# Patient Record
Sex: Female | Born: 1985
Health system: Southern US, Community
[De-identification: ages and names within clinical notes are randomized; demographics above are authoritative.]

## PROBLEM LIST (undated history)

## (undated) DIAGNOSIS — N390 Urinary tract infection, site not specified: Secondary | ICD-10-CM

## (undated) DIAGNOSIS — B019 Varicella without complication: Secondary | ICD-10-CM

## (undated) DIAGNOSIS — F419 Anxiety disorder, unspecified: Secondary | ICD-10-CM

## (undated) HISTORY — DX: Varicella without complication: B01.9

## (undated) HISTORY — DX: Anxiety disorder, unspecified: F41.9

## (undated) HISTORY — DX: Urinary tract infection, site not specified: N39.0

---
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
## 2014-04-24 ENCOUNTER — Encounter: Payer: Self-pay | Admitting: Family

## 2014-04-24 ENCOUNTER — Ambulatory Visit (INDEPENDENT_AMBULATORY_CARE_PROVIDER_SITE_OTHER): Payer: 59 | Admitting: Family

## 2014-04-24 VITALS — BP 108/72 | HR 63 | Temp 98.2°F | Ht 63.0 in | Wt 126.5 lb

## 2014-04-24 DIAGNOSIS — J309 Allergic rhinitis, unspecified: Secondary | ICD-10-CM

## 2014-04-24 NOTE — Progress Notes (Signed)
Pre visit review using our clinic review tool, if applicable. No additional management support is needed unless otherwise documented below in the visit note. 

## 2014-04-24 NOTE — Assessment & Plan Note (Signed)
Symptoms and exam consistent with allergic rhinitis. Recommend starting over-the-counter second-generation antihistamine. May also include over-the-counter steroid. Continue other over-the-counter medications as needed for symptom relief. Follow-up if symptoms worsen or fail to improve. May require referral to allergy testing.

## 2014-04-24 NOTE — Patient Instructions (Signed)
Thank you for choosing [facility].  Summary/Instructions:  Please schedule a time for your physical.   If your symptoms worsen or fail to improve, please contact our office for further instruction, or in case of emergency go directly to the emergency room at the closest medical facility.    General Recommendations:    Please drink plenty of fluids.  Get plenty of rest   Sleep in humidified air  Use saline nasal sprays  Netti pot   OTC Medications:  Decongestants - helps relieve congestion   Flonase (generic fluticasone) or Nasacort (generic triamcinolone) - please make sure to use the "cross-over" technique at a 45 degree angle towards the opposite eye as opposed to straight up the nasal passageway.   Sudafed (generic pseudoephedrine - Note this is the one that is available behind the pharmacy counter); Products with phenylephrine (-PE) may also be used but is often not as effective as pseudoephedrine.   If you have HIGH BLOOD PRESSURE - Coricidin HBP; AVOID any product that is -D as this contains pseudoephedrine which may increase your blood pressure.  Afrin (oxymetazoline) every 6-8 hours for up to 3 days.   Allergies - helps relieve runny nose, itchy eyes and sneezing   Claritin (generic loratidine), Allegra (fexofenidine), or Zyrtec (generic cyrterizine) for runny nose. These medications should not cause drowsiness.  Note - Benadryl (generic diphenhydramine) may be used however may cause drowsiness  Cough -   Delsym or Robitussin (generic dextromethorphan)  Expectorants - helps loosen mucus to ease removal   Mucinex (generic guaifenesin) as directed on the package.  Headaches / General Aches   Tylenol (generic acetaminophen) - DO NOT EXCEED 3 grams (3,000 mg) in a 24 hour time period  Advil/Motrin (generic ibuprofen)   Sore Throat -   Salt water gargle   Chloraseptic (generic benzocaine) spray or lozenges / Sucrets (generic dyclonine)       Allergies Allergies may happen from anything your body is sensitive to. This may be food, medicines, pollens, chemicals, and nearly anything around you in everyday life that produces allergens. An allergen is anything that causes an allergy producing substance. Heredity is often a factor in causing these problems. This means you may have some of the same allergies as your parents. Food allergies happen in all age groups. Food allergies are some of the most severe and life threatening. Some common food allergies are cow's milk, seafood, eggs, nuts, wheat, and soybeans. SYMPTOMS  4. Swelling around the mouth. 5. An itchy red rash or hives. 6. Vomiting or diarrhea. 7. Difficulty breathing. SEVERE ALLERGIC REACTIONS ARE LIFE-THREATENING. This reaction is called anaphylaxis. It can cause the mouth and throat to swell and cause difficulty with breathing and swallowing. In severe reactions only a trace amount of food (for example, peanut oil in a salad) may cause death within seconds. Seasonal allergies occur in all age groups. These are seasonal because they usually occur during the same season every year. They may be a reaction to molds, grass pollens, or tree pollens. Other causes of problems are house dust mite allergens, pet dander, and mold spores. The symptoms often consist of nasal congestion, a runny itchy nose associated with sneezing, and tearing itchy eyes. There is often an associated itching of the mouth and ears. The problems happen when you come in contact with pollens and other allergens. Allergens are the particles in the air that the body reacts to with an allergic reaction. This causes you to release allergic antibodies. Through  a chain of events, these eventually cause you to release histamine into the blood stream. Although it is meant to be protective to the body, it is this release that causes your discomfort. This is why you were given anti-histamines to feel better. If you are  unable to pinpoint the offending allergen, it may be determined by skin or blood testing. Allergies cannot be cured but can be controlled with medicine. Hay fever is a collection of all or some of the seasonal allergy problems. It may often be treated with simple over-the-counter medicine such as diphenhydramine. Take medicine as directed. Do not drink alcohol or drive while taking this medicine. Check with your caregiver or package insert for child dosages. If these medicines are not effective, there are many new medicines your caregiver can prescribe. Stronger medicine such as nasal spray, eye drops, and corticosteroids may be used if the first things you try do not work well. Other treatments such as immunotherapy or desensitizing injections can be used if all else fails. Follow up with your caregiver if problems continue. These seasonal allergies are usually not life threatening. They are generally more of a nuisance that can often be handled using medicine. HOME CARE INSTRUCTIONS  2. If unsure what causes a reaction, keep a diary of foods eaten and symptoms that follow. Avoid foods that cause reactions. 3. If hives or rash are present: 1. Take medicine as directed. 2. You may use an over-the-counter antihistamine (diphenhydramine) for hives and itching as needed. 3. Apply cold compresses (cloths) to the skin or take baths in cool water. Avoid hot baths or showers. Heat will make a rash and itching worse. 4. If you are severely allergic: 1. Following a treatment for a severe reaction, hospitalization is often required for closer follow-up. 2. Wear a medic-alert bracelet or necklace stating the allergy. 3. You and your family must learn how to give adrenaline or use an anaphylaxis kit. 4. If you have had a severe reaction, always carry your anaphylaxis kit or EpiPen with you. Use this medicine as directed by your caregiver if a severe reaction is occurring. Failure to do so could have a fatal  outcome. SEEK MEDICAL CARE IF: 2. You suspect a food allergy. Symptoms generally happen within 30 minutes of eating a food. 3. Your symptoms have not gone away within 2 days or are getting worse. 4. You develop new symptoms. 5. You want to retest yourself or your child with a food or drink you think causes an allergic reaction. Never do this if an anaphylactic reaction to that food or drink has happened before. Only do this under the care of a caregiver. SEEK IMMEDIATE MEDICAL CARE IF:  3. You have difficulty breathing, are wheezing, or have a tight feeling in your chest or throat. 4. You have a swollen mouth, or you have hives, swelling, or itching all over your body. 5. You have had a severe reaction that has responded to your anaphylaxis kit or an EpiPen. These reactions may return when the medicine has worn off. These reactions should be considered life threatening. MAKE SURE YOU:  3. Understand these instructions. 4. Will watch your condition. 5. Will get help right away if you are not doing well or get worse. Document Released: 05/20/2002 Document Revised: 06/21/2012 Document Reviewed: 10/25/2007 [facility] Patient Information 2015 [place], [place]. This information is not intended to replace advice given to you by your health care provider. Make sure you discuss any questions you have with your health care  provider.

## 2014-04-24 NOTE — Progress Notes (Signed)
   Subjective:    Patient ID: Cynthia Weaver, female    DOB: 11-Jun-1985, 29 y.o.   MRN: [id number]  Chief Complaint  Patient presents with  . Establish Care    Discuss cough and congestion     HPI:  Cynthia Weaver is a 29 y.o. female who presents today to establish care and discuss constant illness.   Generalized illness/Allergies - This is a new problem. Associated symptoms of congestion and cough which has been going on for about a month and half. Indicates the symptoms wax and wane. Modifying treatments of robutussin and dayquil which help with the symptoms. Timing the of symptoms is worse in the morning.    Allergies  Allergen Reactions  . Penicillins Hives    No current outpatient prescriptions on file prior to visit.   No current facility-administered medications on file prior to visit.    Past Medical History  Diagnosis Date  . Chicken pox   . UTI (lower urinary tract infection)     History reviewed. No pertinent past surgical history.  Family History  Problem Relation Age of Onset  . Healthy Mother   . Healthy Father   . Breast cancer Maternal Aunt   . Prostate cancer Maternal Uncle     History   Social History  . Marital Status: Married    Spouse Name: N/A  . Number of Children: N/A  . Years of Education: N/A   Occupational History  . Not on file.   Social History Main Topics  . Smoking status: Never Smoker   . Smokeless tobacco: Never Used  . Alcohol Use: 0.0 oz/week    0 Standard drinks or equivalent per week     Comment: occasional  . Drug Use: No  . Sexual Activity: Yes    Birth Control/ Protection: Condom   Other Topics Concern  . Not on file   Social History Narrative    Review of Systems  Constitutional: Negative for fever and chills.  HENT: Positive for congestion and sneezing. Negative for sinus pressure and sore throat.   Eyes: Positive for itching.  Respiratory: Positive for cough.   Neurological: Negative for headaches.       Objective:    BP 108/72 mmHg  Pulse 63  Temp(Src) 98.2 F (36.8 C) (Oral)  Wt 126 lb 8 oz (57.38 kg)  SpO2 98% Nursing note and vital signs reviewed.  Physical Exam  Constitutional: She is oriented to person, place, and time. She appears well-developed and well-nourished. No distress.  Cardiovascular: Normal rate, regular rhythm, normal heart sounds and intact distal pulses.   Pulmonary/Chest: Effort normal and breath sounds normal.  Neurological: She is alert and oriented to person, place, and time.  Skin: Skin is warm and dry.  Psychiatric: She has a normal mood and affect. Her behavior is normal. Judgment and thought content normal.       Assessment & Plan:

## 2014-08-19 ENCOUNTER — Telehealth: Payer: Self-pay | Admitting: Family

## 2014-08-19 NOTE — Telephone Encounter (Signed)
Records received from [facility], forwarded to Primary Care, rmf.

## 2014-09-20 LAB — OB RESULTS CONSOLE RPR: RPR: NONREACTIVE

## 2014-09-21 ENCOUNTER — Telehealth: Payer: Self-pay | Admitting: Family

## 2014-09-21 NOTE — Telephone Encounter (Signed)
Received records from [place] forwarded to [facility] 09/21/14 fbg.

## 2014-09-22 LAB — OB RESULTS CONSOLE GC/CHLAMYDIA
CHLAMYDIA, DNA PROBE: NEGATIVE
Gonorrhea: NEGATIVE

## 2014-09-22 LAB — OB RESULTS CONSOLE ABO/RH: RH Type: POSITIVE

## 2014-09-22 LAB — OB RESULTS CONSOLE RUBELLA ANTIBODY, IGM: RUBELLA: IMMUNE

## 2014-09-22 LAB — OB RESULTS CONSOLE ANTIBODY SCREEN: Antibody Screen: NEGATIVE

## 2014-09-22 LAB — OB RESULTS CONSOLE HIV ANTIBODY (ROUTINE TESTING): HIV: NONREACTIVE

## 2014-09-22 LAB — OB RESULTS CONSOLE HEPATITIS B SURFACE ANTIGEN: HEP B S AG: NEGATIVE

## 2014-10-20 ENCOUNTER — Telehealth: Payer: Self-pay | Admitting: Family

## 2014-10-20 NOTE — Telephone Encounter (Signed)
Received records from [facility] forwarded 6 pages to Dr. Mauricio Po 10/20/14 fbg.

## 2014-11-20 ENCOUNTER — Telehealth: Payer: Self-pay | Admitting: Family

## 2014-11-20 NOTE — Telephone Encounter (Signed)
Rec'd from [facility] forward 6 pages to Dr. Elna Breslow

## 2015-03-11 NOTE — L&D Delivery Note (Signed)
Delivery Note Pt with increased pressure, SVE 10/100/+2 - AROM at 10:35 for clear fluid.  At 10:37 PM a viable and healthy female was delivered via Vaginal, Spontaneous Delivery (Presentation: Left Occiput Anterior).  APGAR: 9, 9; weight  P.   Placenta status: Intact, Spontaneous.  Cord: 3 vessels with the following complications: None.    Anesthesia: None  Episiotomy: None Lacerations: 2nd degree;Perineal Suture Repair: 3.0 vicryl rapide Est. Blood Loss (mL): 40  Mom to postpartum.  Baby to Couplet care / Skin to Skin.  Pt and physician discussed prior to  Delivery pt desire for removal of skin tag on Mons, d/w pt r/b/a. Anesthetized with 1% lidocaine, removed sharply.  Skin edges reapproximated with figure of eight suture.  Skin tag/mole sent to pathology  Cynthia Weaver, Cynthia Weaver 04/12/2015, 11:27 PM  Br/B+/RI/Tdap refused in PNC/Contra?

## 2015-04-12 ENCOUNTER — Inpatient Hospital Stay (HOSPITAL_COMMUNITY)
Admission: AD | Admit: 2015-04-12 | Discharge: 2015-04-14 | DRG: 775 | Disposition: A | Discharge status: Home / Self Care | Payer: 59 | Source: Ambulatory Visit | Attending: Obstetrics and Gynecology | Admitting: Obstetrics and Gynecology

## 2015-04-12 ENCOUNTER — Encounter (HOSPITAL_COMMUNITY): Payer: Self-pay | Admitting: Anesthesiology

## 2015-04-12 ENCOUNTER — Encounter (HOSPITAL_COMMUNITY): Payer: Self-pay | Admitting: *Deleted

## 2015-04-12 DIAGNOSIS — D280 Benign neoplasm of vulva: Secondary | ICD-10-CM | POA: Diagnosis not present

## 2015-04-12 DIAGNOSIS — Z3A39 39 weeks gestation of pregnancy: Secondary | ICD-10-CM | POA: Diagnosis not present

## 2015-04-12 DIAGNOSIS — O99824 Streptococcus B carrier state complicating childbirth: Secondary | ICD-10-CM | POA: Diagnosis present

## 2015-04-12 DIAGNOSIS — IMO0001 Reserved for inherently not codable concepts without codable children: Secondary | ICD-10-CM

## 2015-04-12 DIAGNOSIS — L918 Other hypertrophic disorders of the skin: Secondary | ICD-10-CM | POA: Diagnosis present

## 2015-04-12 LAB — CBC
HEMATOCRIT: 37.7 % (ref 36.0–46.0)
Hemoglobin: 13.9 g/dL (ref 12.0–15.0)
MCH: 33.6 pg (ref 26.0–34.0)
MCHC: 36.9 g/dL — AB (ref 30.0–36.0)
MCV: 91.1 fL (ref 78.0–100.0)
Platelets: 250 10*3/uL (ref 150–400)
RBC: 4.14 MIL/uL (ref 3.87–5.11)
RDW: 12.6 % (ref 11.5–15.5)
WBC: 12.9 10*3/uL — ABNORMAL HIGH (ref 4.0–10.5)

## 2015-04-12 LAB — TYPE AND SCREEN
ABO/RH(D): B POS
ANTIBODY SCREEN: NEGATIVE

## 2015-04-12 LAB — OB RESULTS CONSOLE GBS: STREP GROUP B AG: POSITIVE

## 2015-04-12 MED ORDER — OXYCODONE-ACETAMINOPHEN 5-325 MG PO TABS
2.0000 | ORAL_TABLET | ORAL | Status: DC | PRN
  Filled 2015-04-12: qty 2

## 2015-04-12 MED ORDER — ACETAMINOPHEN 325 MG PO TABS: 650.0000 mg | ORAL_TABLET | ORAL | Status: DC | PRN

## 2015-04-12 MED ORDER — ONDANSETRON HCL 4 MG/2ML IJ SOLN: 4.0000 mg | Freq: Four times a day (QID) | INTRAMUSCULAR | Status: DC | PRN

## 2015-04-12 MED ORDER — LIDOCAINE HCL (PF) 1 % IJ SOLN
30.0000 mL | INTRAMUSCULAR | Status: DC | PRN
  Administered 2015-04-12: 30 mL via SUBCUTANEOUS
  Filled 2015-04-12: qty 30

## 2015-04-12 MED ORDER — CEFAZOLIN SODIUM-DEXTROSE 2-3 GM-% IV SOLR
2.0000 g | Freq: Once | INTRAVENOUS | Status: AC
  Administered 2015-04-12: 2 g via INTRAVENOUS
  Filled 2015-04-12: qty 50

## 2015-04-12 MED ORDER — IBUPROFEN 600 MG PO TABS
600.0000 mg | ORAL_TABLET | Freq: Four times a day (QID) | ORAL | Status: DC
  Administered 2015-04-12 – 2015-04-14 (×6): 600 mg via ORAL
  Filled 2015-04-12 (×6): qty 1

## 2015-04-12 MED ORDER — FLEET ENEMA 7-19 GM/118ML RE ENEM: 1.0000 | ENEMA | Freq: Once | RECTAL | Status: DC

## 2015-04-12 MED ORDER — CITRIC ACID-SODIUM CITRATE 334-500 MG/5ML PO SOLN
30.0000 mL | ORAL | Status: DC | PRN
Start: 2015-04-12 — End: 2015-04-13

## 2015-04-12 MED ORDER — FENTANYL 2.5 MCG/ML BUPIVACAINE 1/10 % EPIDURAL INFUSION (WH - ANES)
14.0000 mL/h | INTRAMUSCULAR | Status: DC | PRN
  Filled 2015-04-12: qty 125

## 2015-04-12 MED ORDER — PHENYLEPHRINE 40 MCG/ML (10ML) SYRINGE FOR IV PUSH (FOR BLOOD PRESSURE SUPPORT)
80.0000 ug | PREFILLED_SYRINGE | INTRAVENOUS | Status: DC | PRN
  Filled 2015-04-12: qty 2
  Filled 2015-04-12: qty 20

## 2015-04-12 MED ORDER — OXYCODONE-ACETAMINOPHEN 5-325 MG PO TABS
1.0000 | ORAL_TABLET | ORAL | Status: DC | PRN
  Administered 2015-04-12: 1 via ORAL

## 2015-04-12 MED ORDER — EPHEDRINE 5 MG/ML INJ
10.0000 mg | INTRAVENOUS | Status: DC | PRN
  Filled 2015-04-12: qty 2

## 2015-04-12 MED ORDER — DIPHENHYDRAMINE HCL 50 MG/ML IJ SOLN: 12.5000 mg | INTRAMUSCULAR | Status: DC | PRN

## 2015-04-12 MED ORDER — CLINDAMYCIN PHOSPHATE 900 MG/50ML IV SOLN: 900.0000 mg | Freq: Once | INTRAVENOUS | Status: DC

## 2015-04-12 MED ORDER — LACTATED RINGERS IV SOLN: 500.0000 mL | INTRAVENOUS | Status: DC | PRN

## 2015-04-12 MED ORDER — OXYTOCIN BOLUS FROM INFUSION
500.0000 mL | INTRAVENOUS | Status: DC
  Administered 2015-04-12: 500 mL via INTRAVENOUS

## 2015-04-12 MED ORDER — OXYTOCIN 10 UNIT/ML IJ SOLN
2.5000 [IU]/h | INTRAVENOUS | Status: DC
  Filled 2015-04-12: qty 4

## 2015-04-12 MED ORDER — LACTATED RINGERS IV SOLN
INTRAVENOUS | Status: DC
  Administered 2015-04-12: 22:00:00 via INTRAVENOUS

## 2015-04-12 MED ORDER — BUTORPHANOL TARTRATE 1 MG/ML IJ SOLN: 1.0000 mg | INTRAMUSCULAR | Status: DC | PRN

## 2015-04-12 NOTE — MAU Note (Signed)
PT SAYS HURT BAD  SINCE 7PM.      VE IN OFFICE  TODAY   3-4  CM.     DENIES  HSV AND MRSA.   GBS-   POSITIVE.

## 2015-04-12 NOTE — H&P (Signed)
Cynthia Weaver is a 30 y.o. female G2P1001 at 39+ presents with advanced cervical dilitation.  +FM, no LOF, no VB, ctx - increasing in intensity and frequency.  Relatively uncomplicated prenatal care.  Dated by first trimester [place].  Has mole on her mons wishes to have removed at delivery, d/w pt risk of scarring.    Maternal Medical History:  Reason for admission: Contractions.   Contractions: Onset was 3-5 hours ago.   Frequency: regular.   Perceived severity is moderate.    Fetal activity: Perceived fetal activity is normal.    Prenatal Complications - Diabetes: none.    OB History    Gravida Para Term Preterm AB TAB SAB Ectopic Multiple Living   2 1 0 0 0 0 0 0 0 1     G1 41wk SVD female "Cynthia Weaver" G2 present  H/o abn pap, cryo, last WNL No STDs  Past Medical History  Diagnosis Date  . Chicken pox   . UTI (lower urinary tract infection)    Past Surgical History  Procedure Laterality Date  . Wisdom tooth extraction    cryotherapy cervix  Family History: family history includes Breast cancer in her maternal aunt; Healthy in her father and mother; Prostate cancer in her maternal uncle. Social History:  reports that she has never smoked. She has never used smokeless tobacco. She reports that she drinks alcohol. She reports that she does not use illicit drugs.married  Meds PNV All PCN - hives   Prenatal Transfer Tool  Maternal Diabetes: No Genetic Screening: Normal Maternal Ultrasounds/Referrals: Normal Fetal Ultrasounds or other Referrals:  None Maternal Substance Abuse:  No Significant Maternal Medications:  None Significant Maternal Lab Results:  Lab values include: Group B Strep positive Other Comments:  CF neg  Review of Systems  Constitutional: Negative.   HENT: Negative.   Eyes: Negative.   Respiratory: Negative.   Cardiovascular: Negative.   Gastrointestinal: Negative.   Genitourinary: Negative.   Musculoskeletal: Negative.   Skin: Negative.    Neurological: Negative.   Psychiatric/Behavioral: Negative.     Dilation: 8 Effacement (%): 90 Station: -2 Exam by:: L. Paschal,RN  Blood pressure 113/76, pulse 93, temperature 97.8 F (36.6 C), temperature source Oral, resp. rate 20, height 5\' 3"  (1.6 m), weight 72.292 kg (159 lb 6 oz). Maternal Exam:  Uterine Assessment: Contraction strength is moderate.  Contraction frequency is regular.   Abdomen: Patient reports no abdominal tenderness. Fundal height is appropriate for gestation.   Estimated fetal weight is 7.5-8.5#.   Fetal presentation: vertex  Introitus: Normal vulva. Normal vagina.  Pelvis: adequate for delivery.   Cervix: Cervix evaluated by digital exam.     Physical Exam  Constitutional: She is oriented to person, place, and time. She appears well-developed and well-nourished.  HENT:  Head: Normocephalic and atraumatic.  Cardiovascular: Normal rate and regular rhythm.   Respiratory: Effort normal and breath sounds normal. No respiratory distress. She has no wheezes.  GI: Soft. Bowel sounds are normal. She exhibits no distension. There is no tenderness.  Musculoskeletal: Normal range of motion.  Neurological: She is alert and oriented to person, place, and time.  Skin: Skin is warm and dry.  Psychiatric: She has a normal mood and affect. Her behavior is normal.    Prenatal labs: ABO, Rh: B/Positive/-- (07/15 0000) Antibody: Negative (07/15 0000) Rubella: Immune (07/15 0000) RPR: Nonreactive (07/13 0000)  HBsAg: Negative (07/15 0000)  HIV: Non-reactive (07/15 0000)  GBS: Positive (02/02 0000)   Hgb 13.5/Plt 290/Ur Cx +  GBBS/CF neg/GC neg/Chl neg/First Trimester Scr WNL/glucola 82  Dated by First Trimester [place] Nl anat, post plac, female Tdap declined Assessment/Plan: 30yo G2P1001 at 39+ in active labor Kefzol for gbbs prophylaxis Epidural prn Expect SVD   Bovard-Stuckert, Mayari Matus 04/12/2015, 10:28 PM

## 2015-04-12 NOTE — Anesthesia Preprocedure Evaluation (Deleted)
Anesthesia Evaluation  Patient identified by MRN, date of birth, ID band Patient awake    Reviewed: Allergy & Precautions, H&P , Patient's Chart, lab work & pertinent test results  Airway Mallampati: II  TM Distance: >3 FB Neck ROM: full    Dental  (+) Teeth Intact   Pulmonary    breath sounds clear to auscultation       Cardiovascular  Rhythm:regular Rate:Normal     Neuro/Psych    GI/Hepatic   Endo/Other    Renal/GU      Musculoskeletal   Abdominal   Peds  Hematology   Anesthesia Other Findings  O/w healthy; now 8cm. Discussed with Dr Rodman Comp ; she is healthy and no perinatal complications  NO LABS NEEDED     Reproductive/Obstetrics (+) Pregnancy                             Anesthesia Physical Anesthesia Plan  ASA: II  Anesthesia Plan: Epidural   Post-op Pain Management:    Induction:   Airway Management Planned:   Additional Equipment:   Intra-op Plan:   Post-operative Plan:   Informed Consent: I have reviewed the patients History and Physical, chart, labs and discussed the procedure including the risks, benefits and alternatives for the proposed anesthesia with the patient or authorized representative who has indicated his/her understanding and acceptance.   Dental Advisory Given  Plan Discussed with:   Anesthesia Plan Comments: (Labs checked- platelets confirmed with RN in room. Fetal heart tracing, per RN, reported to be stable enough for sitting procedure. Discussed epidural, and patient consents to the procedure:  included risk of possible headache,backache, failed block, allergic reaction, and nerve injury. This patient was asked if she had any questions or concerns before the procedure started.)        Anesthesia Quick Evaluation

## 2015-04-13 ENCOUNTER — Encounter (HOSPITAL_COMMUNITY): Payer: Self-pay

## 2015-04-13 LAB — CBC
HEMATOCRIT: 36 % (ref 36.0–46.0)
Hemoglobin: 12.9 g/dL (ref 12.0–15.0)
MCH: 33.1 pg (ref 26.0–34.0)
MCHC: 35.8 g/dL (ref 30.0–36.0)
MCV: 92.3 fL (ref 78.0–100.0)
Platelets: 216 10*3/uL (ref 150–400)
RBC: 3.9 MIL/uL (ref 3.87–5.11)
RDW: 12.6 % (ref 11.5–15.5)
WBC: 15.8 10*3/uL — ABNORMAL HIGH (ref 4.0–10.5)

## 2015-04-13 LAB — RPR: RPR Ser Ql: NONREACTIVE

## 2015-04-13 LAB — ABO/RH: ABO/RH(D): B POS

## 2015-04-13 MED ORDER — ONDANSETRON HCL 4 MG PO TABS: 4.0000 mg | ORAL_TABLET | ORAL | Status: DC | PRN

## 2015-04-13 MED ORDER — ACETAMINOPHEN 325 MG PO TABS: 650.0000 mg | ORAL_TABLET | ORAL | Status: DC | PRN

## 2015-04-13 MED ORDER — BENZOCAINE-MENTHOL 20-0.5 % EX AERO: 1.0000 "application " | INHALATION_SPRAY | CUTANEOUS | Status: DC | PRN

## 2015-04-13 MED ORDER — SIMETHICONE 80 MG PO CHEW: 80.0000 mg | CHEWABLE_TABLET | ORAL | Status: DC | PRN

## 2015-04-13 MED ORDER — DIPHENHYDRAMINE HCL 25 MG PO CAPS: 25.0000 mg | ORAL_CAPSULE | Freq: Four times a day (QID) | ORAL | Status: DC | PRN

## 2015-04-13 MED ORDER — PRENATAL MULTIVITAMIN CH
1.0000 | ORAL_TABLET | Freq: Every day | ORAL | Status: DC
  Administered 2015-04-13: 1 via ORAL
  Filled 2015-04-13: qty 1

## 2015-04-13 MED ORDER — ONDANSETRON HCL 4 MG/2ML IJ SOLN: 4.0000 mg | INTRAMUSCULAR | Status: DC | PRN

## 2015-04-13 MED ORDER — LANOLIN HYDROUS EX OINT: TOPICAL_OINTMENT | CUTANEOUS | Status: DC | PRN

## 2015-04-13 MED ORDER — ZOLPIDEM TARTRATE 5 MG PO TABS: 5.0000 mg | ORAL_TABLET | Freq: Every evening | ORAL | Status: DC | PRN

## 2015-04-13 MED ORDER — WITCH HAZEL-GLYCERIN EX PADS: 1.0000 "application " | MEDICATED_PAD | CUTANEOUS | Status: DC | PRN

## 2015-04-13 MED ORDER — DIBUCAINE 1 % RE OINT: 1.0000 "application " | TOPICAL_OINTMENT | RECTAL | Status: DC | PRN

## 2015-04-13 MED ORDER — LACTATED RINGERS IV SOLN: INTRAVENOUS | Status: DC

## 2015-04-13 MED ORDER — TETANUS-DIPHTH-ACELL PERTUSSIS 5-2.5-18.5 LF-MCG/0.5 IM SUSP
0.5000 mL | Freq: Once | INTRAMUSCULAR | Status: DC
Start: 2015-04-13 — End: 2015-04-14
  Filled 2015-04-13: qty 0.5

## 2015-04-13 MED ORDER — SENNOSIDES-DOCUSATE SODIUM 8.6-50 MG PO TABS
2.0000 | ORAL_TABLET | ORAL | Status: DC
  Administered 2015-04-13: 2 via ORAL
  Filled 2015-04-13: qty 2

## 2015-04-13 NOTE — Lactation Note (Signed)
This note was copied from the chart of Cynthia Chereen Barreca. Lactation Consultation Note  Initial visit made.  Baby is 29 hours old and has been latching easily to breast.  Mom has some concerns about periods of no interest then cluster feeds.  Reassured her this is normal newborn feeding behavior.  Instructed to feed with any cue and to call for assist prn.  Patient Name: Cynthia Weaver [id number] Date: 04/13/2015 Reason for consult: Initial assessment   Maternal Data Formula Feeding for Exclusion: No Does the patient have breastfeeding experience prior to this delivery?: Yes  Feeding    LATCH Score/Interventions                      Lactation Tools Discussed/Used     Consult Status Consult Status: Follow-up Date: 04/14/15 Follow-up type: In-patient    Ave Filter 04/13/2015, 3:05 PM

## 2015-04-13 NOTE — Progress Notes (Signed)
Post Partum Day 1 Subjective: no complaints, up ad lib, tolerating PO and ml lochia, pain controlled  Objective: Blood pressure 97/58, pulse 88, temperature 98.6 F (37 C), temperature source Oral, resp. rate 18, height 5\' 3"  (1.6 m), weight 72.292 kg (159 lb 6 oz), SpO2 98 %, unknown if currently breastfeeding.  Physical Exam:  General: alert and no distress Lochia: appropriate Uterine Fundus: firm  Recent Labs  04/12/15 2220 04/13/15 0611  HGB 13.9 12.9  HCT 37.7 36.0    Assessment/Plan: Plan for discharge tomorrow, Breastfeeding and Lactation consult.  Routine care.     LOS: 1 day   Bovard-Stuckert, Markie Heffernan 04/13/2015, 8:11 AM

## 2015-04-14 NOTE — Discharge Instructions (Signed)
As per discharge pamphlet °

## 2015-04-14 NOTE — Progress Notes (Signed)
PPD #2 No problems Afeb, VSS Fundus firm D/c home

## 2015-04-14 NOTE — Discharge Summary (Signed)
    OB Discharge Summary     Patient Name: Cynthia Weaver DOB: [birth year] MRN: [id number]  Date of admission: 04/12/2015 Delivering MD: Janyth Contes   Date of discharge: 04/14/2015  Admitting diagnosis: 39w ctx Intrauterine pregnancy: [redacted]w[redacted]d     Secondary diagnosis:  Principal Problem:   SVD (spontaneous vaginal delivery) Active Problems:   Active labor at term      Discharge diagnosis: Term Pregnancy [facility] course:  Onset of Labor With Vaginal Delivery     30 y.o. yo G2P1001 at [redacted]w[redacted]d was admitted in Active Labor on 04/12/2015. Patient had an uncomplicated labor course as follows:  Membrane Rupture Time/Date: 10:36 PM ,04/12/2015   Intrapartum Procedures: Episiotomy: None [1]                                         Lacerations:  2nd degree [3];Perineal [11]  Patient had a delivery of a Viable infant. 04/12/2015  Information for the patient's newborn:  Verlena, Siener [id number]  Delivery Method: Vaginal, Spontaneous Delivery (Filed from Delivery Summary)    Pateint had an uncomplicated postpartum course.  She is ambulating, tolerating a regular diet, passing flatus, and urinating well. Patient is discharged home in stable condition on 04/14/2015.    Physical exam  Filed Vitals:   04/13/15 0618 04/13/15 1416 04/13/15 1735 04/14/15 0600  BP: 97/58 112/68 101/65 100/65  Pulse: 88 89 82 86  Temp: 98.6 F (37 C) 98.6 F (37 C) 98.6 F (37 C) 98.2 F (36.8 C)  TempSrc: Oral Oral Oral Oral  Resp: 18 18 18 18   Height:      Weight:      SpO2: 98% 97%     General: alert Lochia: appropriate Uterine Fundus: firm  Labs: Lab Results  Component Value Date   WBC 15.8* 04/13/2015   HGB 12.9 04/13/2015   HCT 36.0 04/13/2015   MCV 92.3 04/13/2015   PLT 216 04/13/2015   No flowsheet data found.  Discharge instruction: per After Visit Summary and "Baby and Me Booklet".  After visit meds:    Medication List    TAKE  these medications        prenatal multivitamin Tabs tablet  Take 1 tablet by mouth at bedtime.        Diet: routine diet  Activity: Advance as tolerated. Pelvic rest for 6 weeks.   Outpatient follow up:6 weeks  Newborn Data: Live born female  Birth Weight: 7 lb 9.9 oz (3455 g) APGAR: 9, 9  Baby Feeding: Breast Disposition:home with mother   04/14/2015 Clarene Duke, MD

## 2015-04-14 NOTE — Lactation Note (Signed)
This note was copied from the chart of Cynthia Delano Zalazar. Lactation Consultation Note  Patient Name: Cynthia Weaver [id number] Date: 04/14/2015 Reason for consult: Follow-up assessment Baby is 78 hours old and has been consistent with latching.  Per mom breast are fuller , and warmer.  Baby showing signs of hunger . LC reviewed basics of latching and worked on depth  On the left breast in football position , 10 mins multiply swallows, increased with breast compressions.  Per mom comfortable, breast softened down . Switched to 2nd breast in cross cradle and worked on depth,  Multiply swallows and per mom comfortable.   sore nipple and engorgement and prevention and tx reviewed . Mom instructed on shells as prevention.  Per mom has a DEBP at home.  Mother informed of post-discharge support and given phone number to the lactation department, including services for phone call assistance; out-patient appointments; and breastfeeding support group. List of other breastfeeding resources in the community given in the handout. Encouraged mother to call for problems or concerns related to breastfeeding.  Maternal Data Has patient been taught Hand Expression?: Yes  Feeding Feeding Type: Breast Fed (cross cradle ) Length of feed: 10 min  LATCH Score/Interventions Latch: Grasps breast easily, tongue down, lips flanged, rhythmical sucking.  Audible Swallowing: Spontaneous and intermittent Intervention(s): Skin to skin  Type of Nipple: Everted at rest and after stimulation  Comfort (Breast/Nipple): Filling, red/small blisters or bruises, mild/mod discomfort  Problem noted: Filling  Hold (Positioning): Assistance needed to correctly position infant at breast and maintain latch. Intervention(s): Breastfeeding basics reviewed;Support Pillows;Position options;Skin to skin  LATCH Score: 8  Lactation Tools Discussed/Used Tools: Shells Shell Type: Inverted WIC Program: No   Consult  Status Consult Status: Complete Date: 04/14/15 Follow-up type: In-patient    Myer Haff 04/14/2015, 10:26 AM

## 2015-05-11 ENCOUNTER — Telehealth: Payer: Self-pay | Admitting: Family

## 2015-05-11 ENCOUNTER — Telehealth (HOSPITAL_COMMUNITY): Payer: Self-pay

## 2015-05-11 NOTE — Telephone Encounter (Signed)
Patient had questions about exclusive pumping and recurrent clogged ducts. Recommended increasing flange size on the affected side, being careful with underwire bras and awareness of saturated fat in the diet. She has a #27 flange at home and plans to evaluate if it is more effective in draining the breast. If it is not she will come to the lactation office for further evaluation.  Also recommended hand expression.

## 2015-05-11 NOTE — Telephone Encounter (Signed)
Patient Name: Cynthia Weaver DOB: 11/10/1985 Initial Comment Caller states has a wart on foot, thinks it's a planters wart, got some otc meds, but says don't take if breastfeeding Nurse Assessment Guidelines Guideline Title Affirmed Question Affirmed Notes Final Disposition User FINAL ATTEMPT MADE - no message left [place], Therapist, sports, [employer]

## 2015-05-30 DIAGNOSIS — Z1151 Encounter for screening for human papillomavirus (HPV): Secondary | ICD-10-CM | POA: Diagnosis not present

## 2015-05-30 DIAGNOSIS — Z124 Encounter for screening for malignant neoplasm of cervix: Secondary | ICD-10-CM | POA: Diagnosis not present

## 2015-05-30 DIAGNOSIS — Z3009 Encounter for other general counseling and advice on contraception: Secondary | ICD-10-CM | POA: Diagnosis not present

## 2015-06-08 MED FILL — NORETHINDRONE 0.35 MG TAB: 0.35 | 84 days supply | Qty: 84 | Fill #0

## 2015-06-08 MED FILL — SERTRALINE HCL 50 MG TABLET: 50 | 30 days supply | Qty: 30 | Fill #0

## 2015-08-13 ENCOUNTER — Ambulatory Visit (INDEPENDENT_AMBULATORY_CARE_PROVIDER_SITE_OTHER): Payer: 59 | Admitting: Family

## 2015-08-13 ENCOUNTER — Ambulatory Visit (INDEPENDENT_AMBULATORY_CARE_PROVIDER_SITE_OTHER)
Admission: RE | Admit: 2015-08-13 | Discharge: 2015-08-13 | Disposition: A | Discharge status: Home / Self Care | Payer: 59 | Source: Ambulatory Visit | Attending: Family | Admitting: Family

## 2015-08-13 ENCOUNTER — Encounter: Payer: Self-pay | Admitting: Family

## 2015-08-13 ENCOUNTER — Other Ambulatory Visit (INDEPENDENT_AMBULATORY_CARE_PROVIDER_SITE_OTHER): Payer: 59

## 2015-08-13 VITALS — BP 110/70 | HR 87 | Temp 98.4°F | Ht 63.0 in | Wt 129.1 lb

## 2015-08-13 DIAGNOSIS — R0602 Shortness of breath: Secondary | ICD-10-CM | POA: Diagnosis not present

## 2015-08-13 LAB — BRAIN NATRIURETIC PEPTIDE: Pro B Natriuretic peptide (BNP): 22 pg/mL (ref 0.0–100.0)

## 2015-08-13 LAB — D-DIMER, QUANTITATIVE: D-Dimer, Quant: 0.27 ug/mL-FEU (ref 0.00–0.48)

## 2015-08-13 NOTE — Progress Notes (Signed)
Pre visit review using our clinic review tool, if applicable. No additional management support is needed unless otherwise documented below in the visit note. 

## 2015-08-13 NOTE — Progress Notes (Signed)
Subjective:    Patient ID: Cynthia Weaver, female    DOB: [birth year], 30 y.o.   MRN: [id number]   ANGELIKI BUGH is a 30 y.o. female who presents today for an acute visit.    HPI Comments: Patient here for evaluation of shortness of breath over past month and a half. Doesn't feel SOB right now. ' Cannot get a deep breath, more annoying than anything.' Notices worse in the morning and after drinking alcohol. No SOB when jogging last week. She notes that she drove to [place], 8 hour car ride, one week ago. Started birth control 3 months ago. Maybe a little anxiety. Recently had a child, 04/2015. Denies lower show any swelling, wheezing, chest pain, palpitations. She has no personal history of cardiac disease, clotting disorder or family history of sudden cardiac death. Shortness of Breath This is a new problem. The current episode started more than 1 month ago. The problem occurs intermittently. The problem has been unchanged. Pertinent negatives include no chest pain, fever, headaches, leg pain, leg swelling, orthopnea, vomiting or wheezing. Exacerbated by: caffeine, alcohol. Risk factors include oral contraceptive (8 hour car ride). She has tried nothing for the symptoms. Her past medical history is significant for allergies. There is no history of COPD, DVT, PE or a recent surgery.   Past Medical History  Diagnosis Date  . Chicken pox   . UTI (lower urinary tract infection)   . SVD (spontaneous vaginal delivery) 04/12/2015   Allergies: Penicillins Current Outpatient Prescriptions on File Prior to Visit  Medication Sig Dispense Refill  . Prenatal Vit-Fe Fumarate-FA (PRENATAL MULTIVITAMIN) TABS tablet Take 1 tablet by mouth at bedtime.      No current facility-administered medications on file prior to visit.    Social History  Substance Use Topics  . Smoking status: Never Smoker   . Smokeless tobacco: Never Used  . Alcohol Use: 0.0 oz/week    0 Standard drinks or equivalent per week     Comment: occasional    Review of Systems  Constitutional: Negative for fever and chills.  HENT: Negative for congestion.   Respiratory: Positive for shortness of breath. Negative for cough, chest tightness and wheezing.   Cardiovascular: Negative for chest pain, palpitations, orthopnea and leg swelling.  Gastrointestinal: Negative for nausea and vomiting.  Neurological: Negative for dizziness and headaches.      Objective:    BP 110/70 mmHg  Pulse 87  Temp(Src) 98.4 F (36.9 C) (Oral)  Ht 5\' 3"  (1.6 m)  Wt 129 lb 1 oz (58.542 kg)  BMI 22.87 kg/m2  SpO2 98%  Breastfeeding? No   Physical Exam  Constitutional: She appears well-developed and well-nourished.  Eyes: Conjunctivae are normal.  Cardiovascular: Normal rate, regular rhythm, normal heart sounds and normal pulses.   No LE edema, palpable cords or masses. No erythema or increased warmth. Negative Homan sign bilaterally.  LE hair growth symmetric and present. No discoloration of varicosities noted. LE warm and palpable pedal pulses.   Pulmonary/Chest: Effort normal and breath sounds normal. She has no wheezes. She has no rhonchi. She has no rales.  Neurological: She is alert.  Skin: Skin is warm and dry.  Psychiatric: She has a normal mood and affect. Her speech is normal and behavior is normal. Thought content normal.  Vitals reviewed.      Assessment & Plan:   1. SOB (shortness of breath) Shortness of breath is nonspecific at this time. Pending lab work to further evaluate for PE  or cardiomyopathy status post pregnancy. Patient has had the shortness of breath for the past 6 weeks. She is not in any acute respiratory distress and her pulse ox 98%, HR 87. EKG normal sinus rhythm. No acute ischemia.  If lab work comes back negative, I discussed with patient I suspect this may be seasonal allergies since she is recently moved from [place] and would could trial an albuterol inhaler. Alternately, anxiety.  -  D-Dimer, Quantitative; Future - B Nat Peptide; Future - DG Chest 2 View    I am having Ms. Shirah maintain her prenatal multivitamin and norethindrone.   Meds ordered this encounter  Medications  . norethindrone (MICRONOR,CAMILA,ERRIN) 0.35 MG tablet    Sig:     Refill:  2     Start medications as prescribed and explained to patient on After Visit Summary ( AVS). Risks, benefits, and alternatives of the medications and treatment plan prescribed today were discussed, and patient expressed understanding.   Education regarding symptom management and diagnosis given to patient.   Follow-up:Plan follow-up and return precautions given if any worsening symptoms or change in condition.   Continue to follow with Mauricio Po, FNP for routine health maintenance.   Cynthia Weaver and I agreed with plan.   Mable Paris, FNP

## 2015-08-13 NOTE — Patient Instructions (Signed)
Lab work downstairs and chest x-ray.  If you have any acute worsening of shortness of breath during this interim we get results back, please call 911.  If there is no improvement in your symptoms, or if there is any worsening of symptoms, or if you have any additional concerns, please return for re-evaluation; or, if we are closed, consider going to the Emergency Room for evaluation if symptoms urgent.  Shortness of Breath Shortness of breath means you have trouble breathing. It could also mean that you have a medical problem. You should get immediate medical care for shortness of breath. CAUSES   Not enough oxygen in the air such as with high altitudes or a smoke-filled room.  Certain lung diseases, infections, or problems.  Heart disease or conditions, such as angina or heart failure.  Low red blood cells (anemia).  Poor physical fitness, which can cause shortness of breath when you exercise.  Chest or back injuries or stiffness.  Being overweight.  Smoking.  Anxiety, which can make you feel like you are not getting enough air. DIAGNOSIS  Serious medical problems can often be found during your physical exam. Tests may also be done to determine why you are having shortness of breath. Tests may include:  Chest X-rays.  Lung function tests.  Blood tests.  An electrocardiogram (ECG).  An ambulatory electrocardiogram. An ambulatory ECG records your heartbeat patterns over a 24-hour period.  Exercise testing.  A transthoracic echocardiogram (TTE). During echocardiography, sound waves are used to evaluate how blood flows through your heart.  A transesophageal echocardiogram (TEE).  Imaging scans. Your health care provider may not be able to find a cause for your shortness of breath after your exam. In this case, it is important to have a follow-up exam with your health care provider as directed.  TREATMENT  Treatment for shortness of breath depends on the cause of your  symptoms and can vary greatly. HOME CARE INSTRUCTIONS   Do not smoke. Smoking is a common cause of shortness of breath. If you smoke, ask for help to quit.  Avoid being around chemicals or things that may bother your breathing, such as paint fumes and dust.  Rest as needed. Slowly resume your usual activities.  If medicines were prescribed, take them as directed for the full length of time directed. This includes oxygen and any inhaled medicines.  Keep all follow-up appointments as directed by your health care provider. SEEK MEDICAL CARE IF:   Your condition does not improve in the time expected.  You have a hard time doing your normal activities even with rest.  You have any new symptoms. SEEK IMMEDIATE MEDICAL CARE IF:   Your shortness of breath gets worse.  You feel light-headed, faint, or develop a cough not controlled with medicines.  You start coughing up blood.  You have pain with breathing.  You have chest pain or pain in your arms, shoulders, or abdomen.  You have a fever.  You are unable to walk up stairs or exercise the way you normally do. MAKE SURE YOU:  Understand these instructions.  Will watch your condition.  Will get help right away if you are not doing well or get worse.   This information is not intended to replace advice given to you by your health care provider. Make sure you discuss any questions you have with your health care provider.   Document Released: 11/19/2000 Document Revised: 03/01/2013 Document Reviewed: 05/12/2011 Elsevier Interactive Patient Education [employer].

## 2015-08-14 ENCOUNTER — Telehealth: Payer: Self-pay | Admitting: Family

## 2015-08-14 DIAGNOSIS — R0602 Shortness of breath: Secondary | ICD-10-CM

## 2015-08-14 MED ORDER — ALBUTEROL SULFATE HFA 108 (90 BASE) MCG/ACT IN AERS: 2.0000 | INHALATION_SPRAY | Freq: Four times a day (QID) | RESPIRATORY_TRACT | Status: DC | PRN

## 2015-08-14 MED FILL — VENTOLIN HFA 90 MCG INHALER: 108 (90 BAS | 25 days supply | Qty: 18 | Fill #0

## 2015-08-14 NOTE — Telephone Encounter (Signed)
Pt informed and agreed to provider statement below.

## 2015-08-14 NOTE — Telephone Encounter (Signed)
Please call patient regarding normal D Dimer. Low clinical suspicion for DVT or PE.   I have made a Referral to pulmonology for pulmonary function testing ( this will evaluate for asthma).  Trial of albuterol inhaler to see if symptom improves.   If no improvement, please make follow up to see Cynthia Weaver.

## 2015-08-21 DIAGNOSIS — N926 Irregular menstruation, unspecified: Secondary | ICD-10-CM | POA: Diagnosis not present

## 2015-08-30 DIAGNOSIS — Z30430 Encounter for insertion of intrauterine contraceptive device: Secondary | ICD-10-CM | POA: Diagnosis not present

## 2015-09-14 ENCOUNTER — Telehealth: Payer: 59 | Admitting: Nurse Practitioner

## 2015-09-14 DIAGNOSIS — K130 Diseases of lips: Secondary | ICD-10-CM | POA: Diagnosis not present

## 2015-09-14 MED ORDER — MICONAZOLE NITRATE 2 % EX CREA: 1.0000 "application " | TOPICAL_CREAM | Freq: Two times a day (BID) | CUTANEOUS | Status: DC

## 2015-09-14 NOTE — Progress Notes (Signed)
This is called angular cheilitis- we usually do not treat this with an evisit but I will send in Rx for you- Miconazole topical BID- keep area as clean and dry as possible- See you PCP if does nit improve.

## 2015-09-26 ENCOUNTER — Ambulatory Visit (INDEPENDENT_AMBULATORY_CARE_PROVIDER_SITE_OTHER): Payer: 59 | Admitting: Pulmonary Disease

## 2015-09-26 ENCOUNTER — Encounter: Payer: Self-pay | Admitting: Pulmonary Disease

## 2015-09-26 VITALS — BP 108/66 | HR 68 | Ht 64.25 in | Wt 124.6 lb

## 2015-09-26 DIAGNOSIS — R0600 Dyspnea, unspecified: Secondary | ICD-10-CM

## 2015-09-26 NOTE — Progress Notes (Signed)
   Subjective:    Patient ID: Cynthia Weaver, female    DOB: 04/01/1985, 30 y.o.   MRN: [id number]  HPI Consult for dyspnea.  Cynthia Weaver is a 30 -year-old with no significant past medical history. She has complains of dyspnea at rest which started in April 2017.  Dyspnea does not occur with activity, there are no specific exacerbating factors. Her shortness of breath is not associated with wheezing, cough, sputum production. She does have some mild anxiety issues. She had a baby in February 2017 and was started on a new OCP pill after that. She feels her symptoms are due to this new medication. She subsequently switched to an IUD device in June. She reports that her dyspnea has improved and she is back to baseline now. She has an albuterol rescue inhaler which she hardly uses.   She's had a workup last month including a normal chest x-ray and negative BNP, d-dimer. She's had some mold issues at home last year but not recently. She denies any seasonal allergies, rhinitis, postnasal drip, heartburn.  DATA CXR 08/13/15 No acute abnormality. Images reviewed.  08/13/15 BNP- 22 D dimer 0.27  Social History: She is a never smoker. She drinks alcohol about once a week. No illegal drug use. 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   Family History: Clotting disorder-grandmother.   Past Medical History  Diagnosis Date  . Chicken pox   . UTI (lower urinary tract infection)   . SVD (spontaneous vaginal delivery) 04/12/2015    Current outpatient prescriptions:  .  albuterol (PROVENTIL HFA) 108 (90 Base) MCG/ACT inhaler, Inhale 2 puffs into the lungs every 6 (six) hours as needed for wheezing or shortness of breath., Disp: 1 Inhaler, Rfl: 1 .  miconazole (MICATIN) 2 % cream, Apply 1 application topically 2 (two) times daily. (Patient not taking: Reported on 09/26/2015), Disp: 28.35 g, Rfl: 0  Review of Systems No dyspnea on exertion,  cough, wheezing, sputum production, hemoptysis. No chest pain, palpitation. No nausea, vomiting, diarrhea, constipation. No fevers, chills, malaise, fatigue. No rash, joint pain, joint swelling, stiffness, tenderness. All other review of systems negative.    Objective:   Physical Exam Blood pressure 108/66, pulse 68, height 5' 4.25" (1.632 m), weight 124 lb 9.6 oz (56.518 kg), SpO2 100 %, not currently breastfeeding. Gen: No apparent distress Neuro: No gross focal deficits. HEENT: No JVD, lymphadenopathy, thyromegaly. RS: Clear, No wheeze or crackles CVS: S1-S2 heard, no murmurs rubs gallops. Abdomen: Soft, positive bowel sounds. Musculoskeletal: No edema.    Assessment & Plan:  Eval for dyspnea.  She may be developing reactive airway disease, asthma secondary to seasonal allergies. It is possible that she may have been unusual reaction to OCPs since her symptoms improved after she was taken off it.  However she feels completely normal at present. I discussed further evaluation with PFTs and blood tests for allergies. But as she is feeling well we decided to hold off on further testing. She will call us for revaluation if her symptoms recur. She will continue her albuterol rescue inhaler as needed.  Plan: Return as needed if there is recurrence of symptoms.  Marshell Garfinkel MD [facility] Pager [phone] If no answer or after 3pm call: [phone] 09/26/2015, 12:02 PM

## 2015-10-23 DIAGNOSIS — N921 Excessive and frequent menstruation with irregular cycle: Secondary | ICD-10-CM | POA: Diagnosis not present

## 2015-10-23 DIAGNOSIS — Z30431 Encounter for routine checking of intrauterine contraceptive device: Secondary | ICD-10-CM | POA: Diagnosis not present

## 2015-11-06 DIAGNOSIS — H52223 Regular astigmatism, bilateral: Secondary | ICD-10-CM | POA: Diagnosis not present

## 2015-11-21 ENCOUNTER — Ambulatory Visit (INDEPENDENT_AMBULATORY_CARE_PROVIDER_SITE_OTHER): Payer: 59 | Admitting: Internal Medicine

## 2015-11-21 ENCOUNTER — Encounter: Payer: Self-pay | Admitting: Internal Medicine

## 2015-11-21 VITALS — BP 120/76 | HR 75 | Temp 98.2°F | Resp 20 | Wt 119.0 lb

## 2015-11-21 DIAGNOSIS — D229 Melanocytic nevi, unspecified: Secondary | ICD-10-CM | POA: Diagnosis not present

## 2015-11-21 DIAGNOSIS — Z23 Encounter for immunization: Secondary | ICD-10-CM | POA: Diagnosis not present

## 2015-11-21 DIAGNOSIS — L989 Disorder of the skin and subcutaneous tissue, unspecified: Secondary | ICD-10-CM

## 2015-11-21 NOTE — Assessment & Plan Note (Signed)
?   Wart vs other, ok for derm referral

## 2015-11-21 NOTE — Progress Notes (Signed)
Pre visit review using our clinic review tool, if applicable. No additional management support is needed unless otherwise documented below in the visit note. 

## 2015-11-21 NOTE — Assessment & Plan Note (Signed)
Several at least, pt asks for derm referral for whole body check , will refer derm

## 2015-11-21 NOTE — Patient Instructions (Addendum)
You had the flu shot today  Please continue all other medications as before, and refills have been done if requested.  Please have the pharmacy call with any other refills you may need.  Please keep your appointments with your specialists as you may have planned  You will be contacted regarding the referral for: Dermatology

## 2015-11-21 NOTE — Progress Notes (Signed)
   Subjective:    Patient ID: Cynthia Weaver, female    DOB: [birth year], 30 y.o.   MRN: [id number]  HPI  Here with a 5 mo lesion to medial prox aspect of great toe, warty in nature vs other, is skin colored, nontender, no trauma or ill fitting shoes, no prior hx of same though husband may have one as well to his leg.  Also has several moles to the back, asks for derm referral Past Medical History:  Diagnosis Date  . Chicken pox   . SVD (spontaneous vaginal delivery) 04/12/2015  . UTI (lower urinary tract infection)    Past Surgical History:  Procedure Laterality Date  . WISDOM TOOTH EXTRACTION  2006    reports that she has never smoked. She has never used smokeless tobacco. She reports that she drinks alcohol. She reports that she does not use drugs. family history includes Breast cancer in her maternal aunt; Clotting disorder in her maternal grandmother; Healthy in her father and mother; Prostate cancer in her maternal uncle. Allergies  Allergen Reactions  . Penicillins Hives    Has patient had a PCN reaction causing immediate rash, facial/tongue/throat swelling, SOB or lightheadedness with hypotension: Yes Has patient had a PCN reaction causing severe rash involving mucus membranes or skin necrosis: Yes Has patient had a PCN reaction that required hospitalization Yes Has patient had a PCN reaction occurring within the last 10 years: Yes If all of the above answers are "NO", then may proceed with Cephalosporin use.    Current Outpatient Prescriptions on File Prior to Visit  Medication Sig Dispense Refill  . albuterol (PROVENTIL HFA) 108 (90 Base) MCG/ACT inhaler Inhale 2 puffs into the lungs every 6 (six) hours as needed for wheezing or shortness of breath. 1 Inhaler 1  . miconazole (MICATIN) 2 % cream Apply 1 application topically 2 (two) times daily. 28.35 g 0   No current facility-administered medications on file prior to visit.     Review of Systems  All otherwise neg per  pt     Objective:   Physical Exam BP 120/76   Pulse 75   Temp 98.2 F (36.8 C) (Oral)   Resp 20   Wt 119 lb (54 kg)   SpO2 97%   BMI 20.27 kg/m   VS noted,  Constitutional: Pt appears in no apparent distress HENT: Head: NCAT.  Right Ear: External ear normal.  Left Ear: External ear normal.  Eyes: . Pupils are equal, round, and reactive to light. Conjunctivae and EOM are normal Neck: Normal range of motion. Neck supple.  Cardiovascular: Normal rate and regular rhythm.   Pulmonary/Chest: Effort normal and breath sounds without rales or wheezing.  Neurological: Pt is alert. Not confused , motor grossly intact Skin: Skin is warm. No rash, no LE edema, several moles to upper extremities noted, also left great toe medial prox aspect with 5 mm slightly raised fleshy lesion nontender  Psychiatric: Pt behavior is normal. No agitation.     Assessment & Plan:

## 2015-12-20 DIAGNOSIS — L821 Other seborrheic keratosis: Secondary | ICD-10-CM | POA: Diagnosis not present

## 2015-12-20 DIAGNOSIS — L814 Other melanin hyperpigmentation: Secondary | ICD-10-CM | POA: Diagnosis not present

## 2015-12-20 DIAGNOSIS — L718 Other rosacea: Secondary | ICD-10-CM | POA: Diagnosis not present

## 2015-12-20 DIAGNOSIS — D225 Melanocytic nevi of trunk: Secondary | ICD-10-CM | POA: Diagnosis not present

## 2015-12-20 DIAGNOSIS — L811 Chloasma: Secondary | ICD-10-CM | POA: Diagnosis not present

## 2015-12-20 DIAGNOSIS — B079 Viral wart, unspecified: Secondary | ICD-10-CM | POA: Diagnosis not present

## 2015-12-20 MED FILL — SOD SULFACE-SULFUR 9-4.5% W: 9-4.5 | 30 days supply | Qty: 454 | Fill #0

## 2015-12-25 ENCOUNTER — Telehealth: Payer: Self-pay | Admitting: Internal Medicine

## 2015-12-25 NOTE — Telephone Encounter (Signed)
Rec'd from [place] forward 6 pages to [place]

## 2015-12-25 NOTE — Telephone Encounter (Signed)
Rec'd from [place] forward 1 page to [place]

## 2016-03-25 ENCOUNTER — Encounter: Payer: Self-pay | Admitting: Family

## 2016-05-01 ENCOUNTER — Ambulatory Visit (INDEPENDENT_AMBULATORY_CARE_PROVIDER_SITE_OTHER): Payer: 59 | Admitting: Family Medicine

## 2016-05-01 ENCOUNTER — Encounter: Payer: Self-pay | Admitting: Family Medicine

## 2016-05-01 VITALS — BP 102/70 | HR 82 | Temp 98.1°F | Wt 116.4 lb

## 2016-05-01 DIAGNOSIS — R059 Cough, unspecified: Secondary | ICD-10-CM

## 2016-05-01 DIAGNOSIS — R52 Pain, unspecified: Secondary | ICD-10-CM

## 2016-05-01 DIAGNOSIS — J320 Chronic maxillary sinusitis: Secondary | ICD-10-CM | POA: Diagnosis not present

## 2016-05-01 DIAGNOSIS — R05 Cough: Secondary | ICD-10-CM | POA: Diagnosis not present

## 2016-05-01 LAB — POCT INFLUENZA A: Rapid Influenza A Ag: NEGATIVE

## 2016-05-01 MED ORDER — DOXYCYCLINE HYCLATE 100 MG PO TABS: 100.0000 mg | ORAL_TABLET | Freq: Two times a day (BID) | ORAL | 0 refills | Status: DC

## 2016-05-01 MED ORDER — HYDROCODONE-HOMATROPINE 5-1.5 MG/5ML PO SYRP: 5.0000 mL | ORAL_SOLUTION | Freq: Three times a day (TID) | ORAL | 0 refills | Status: DC | PRN

## 2016-05-01 MED FILL — HYDROCODONE-HOMATROPINE SYR: 5-1.5 | 8 days supply | Qty: 120 | Fill #0

## 2016-05-01 MED FILL — DOXYCYCLINE HYCLATE 100 MG: 100 | 10 days supply | Qty: 20 | Fill #0

## 2016-05-01 NOTE — Progress Notes (Signed)
Pre visit review using our clinic review tool, if applicable. No additional management support is needed unless otherwise documented below in the visit note. 

## 2016-05-01 NOTE — Patient Instructions (Signed)
It was a pleasure to see you today! Please take medication as directed and follow up if symptoms do not improve with treatment in 3 to 4 days, worsen, or you develop a fever >100.

## 2016-05-01 NOTE — Progress Notes (Signed)
Subjective:    Patient ID: Cynthia Weaver, female    DOB: [birth year], 31 y.o.   MRN: [id number]  HPI  Ms. Lay is a 31 year old who presents today with a cough that has been present for two weeks. Cough is nonproductive and does not cause SOB.  Associated symptoms of sinus pressure/pain, nasal congestion, rhinorrhea, and myalgias.  Recent sick contact exposure with children ages 81 and 3 who also attend daycare.  No history of asthma/bronchitis noted and she is a nonsmoker. Influenza vaccine is UTD. Treatment at home with DayQuil and Nyquil have provided moderate benefit. No recent antibiotic treatment. Symptoms have not improved. No aggravating factors noted.   Review of Systems Review of Systems  Constitutional: Negative for chills, fatigue and fever.  HENT: Positive for congestion, sinus pain, sinus pressure. Negative for sore throat, ear pain, postnasal drip and rhinorrhea.   Respiratory: Positive for cough. Negative for wheezing.   Cardiovascular: Negative for chest pain and palpitations.  Gastrointestinal: Negative for abdominal pain, diarrhea, nausea and vomiting.  Musculoskeletal: Positive for myalgias.  Skin: Negative for rash.  Neurological: Negative for dizziness, light-headedness and headaches.   Past Medical History:  Diagnosis Date  . Chicken pox   . SVD (spontaneous vaginal delivery) 04/12/2015  . UTI (lower urinary tract infection)      Social History   Social History  . Marital status: Married    Spouse name: N/A  . Number of children: 1  . Years of education: 64   Occupational History  . Physician Recruiter    Social History Main Topics  . Smoking status: Never Smoker  . Smokeless tobacco: Never Used  . Alcohol use 0.0 oz/week     Comment: occasional wine or beer  . Drug use: No  . Sexual activity: Yes   Other Topics Concern  . Not on file   Social History Narrative   [place], [place] and raised in [place], [place].    Currently resides in a  house with husband and 2 children (daughters)   Fun: Spend time with daughter, walking   Denies any religious beliefs effecting healthcare.    Works for [employer], Primary school teacher             Past Surgical History:  Procedure Laterality Date  . WISDOM TOOTH EXTRACTION  2006    Family History  Problem Relation Age of Onset  . Healthy Mother   . Healthy Father   . Breast cancer Maternal Aunt   . Prostate cancer Maternal Uncle   . Clotting disorder Maternal Grandmother     Allergies  Allergen Reactions  . Penicillins Hives    Has patient had a PCN reaction causing immediate rash, facial/tongue/throat swelling, SOB or lightheadedness with hypotension: Yes Has patient had a PCN reaction causing severe rash involving mucus membranes or skin necrosis: Yes Has patient had a PCN reaction that required hospitalization Yes Has patient had a PCN reaction occurring within the last 10 years: Yes If all of the above answers are "NO", then may proceed with Cephalosporin use.     Current Outpatient Prescriptions on File Prior to Visit  Medication Sig Dispense Refill  . miconazole (MICATIN) 2 % cream Apply 1 application topically 2 (two) times daily. 28.35 g 0  . albuterol (PROVENTIL HFA) 108 (90 Base) MCG/ACT inhaler Inhale 2 puffs into the lungs every 6 (six) hours as needed for wheezing or shortness of breath. (Patient not taking: Reported on 05/01/2016) 1 Inhaler 1  No current facility-administered medications on file prior to visit.     BP 102/70 (BP Location: Left Arm, Patient Position: Sitting, Cuff Size: Normal)   Pulse 82   Temp 98.1 F (36.7 C) (Oral)   Wt 116 lb 6.4 oz (52.8 kg)   SpO2 99%   BMI 19.82 kg/m       Objective:   Physical Exam  Constitutional: She is oriented to person, place, and time. She appears well-developed and well-nourished.  HENT:  Right Ear: Tympanic membrane normal.  Left Ear: Tympanic membrane normal.  Nose: Rhinorrhea present. Right sinus  exhibits maxillary sinus tenderness. Right sinus exhibits no frontal sinus tenderness. Left sinus exhibits maxillary sinus tenderness. Left sinus exhibits no frontal sinus tenderness.  Mouth/Throat: Oropharynx is clear and moist and mucous membranes are normal. No oropharyngeal exudate or posterior oropharyngeal erythema.  Eyes: Pupils are equal, round, and reactive to light. No scleral icterus.  Neck: Neck supple.  Cardiovascular: Normal rate and regular rhythm.   Pulmonary/Chest: Effort normal and breath sounds normal. She has no wheezes. She has no rales.  Lymphadenopathy:    She has no cervical adenopathy.  Neurological: She is alert and oriented to person, place, and time. Coordination normal.  Skin: Skin is warm and dry. No rash noted.  Psychiatric: She has a normal mood and affect. Her behavior is normal. Judgment and thought content normal.        Assessment & Plan:  1. Maxillary sinusitis, unspecified chronicity Exam and history support empiric treatment for bacterial maxillary sinus infection. She reports use of IUD and is not pregnant. Will treat with doxycycline today. Advised patient on supportive measures:  Get rest, drink plenty of fluids, and use tylenol or ibuprofen as needed for pain. Follow up if fever >101, if symptoms worsen or if symptoms are not improved in 3-4 days. Patient  verbalizes understanding and agrees with plan.   - doxycycline (VIBRA-TABS) 100 MG tablet; Take 1 tablet (100 mg total) by mouth 2 (two) times daily.  Dispense: 20 tablet; Refill: 0  2. Body aches Negative for Influenza A and B in office today.  - POCT Influenza A  3. Cough  - HYDROcodone-homatropine (HYCODAN) 5-1.5 MG/5ML syrup; Take 5 mLs by mouth every 8 (eight) hours as needed for cough.  Dispense: 120 mL; Refill: 0  Delano Metz, FNP-C

## 2016-06-25 DIAGNOSIS — Z01419 Encounter for gynecological examination (general) (routine) without abnormal findings: Secondary | ICD-10-CM | POA: Diagnosis not present

## 2016-06-25 DIAGNOSIS — Z6820 Body mass index (BMI) 20.0-20.9, adult: Secondary | ICD-10-CM | POA: Diagnosis not present

## 2016-06-25 DIAGNOSIS — Z30431 Encounter for routine checking of intrauterine contraceptive device: Secondary | ICD-10-CM | POA: Diagnosis not present

## 2016-06-25 DIAGNOSIS — Z1389 Encounter for screening for other disorder: Secondary | ICD-10-CM | POA: Diagnosis not present

## 2017-07-08 DIAGNOSIS — Z130 Encounter for screening for diseases of the blood and blood-forming organs and certain disorders involving the immune mechanism: Secondary | ICD-10-CM | POA: Diagnosis not present

## 2017-07-08 DIAGNOSIS — Z01419 Encounter for gynecological examination (general) (routine) without abnormal findings: Secondary | ICD-10-CM | POA: Diagnosis not present

## 2017-07-08 DIAGNOSIS — Z6821 Body mass index (BMI) 21.0-21.9, adult: Secondary | ICD-10-CM | POA: Diagnosis not present

## 2017-07-08 DIAGNOSIS — Z124 Encounter for screening for malignant neoplasm of cervix: Secondary | ICD-10-CM | POA: Diagnosis not present

## 2017-07-08 DIAGNOSIS — Z1389 Encounter for screening for other disorder: Secondary | ICD-10-CM | POA: Diagnosis not present

## 2017-11-19 ENCOUNTER — Ambulatory Visit: Payer: BLUE CROSS/BLUE SHIELD | Admitting: Medical

## 2017-11-19 ENCOUNTER — Encounter: Payer: Self-pay | Admitting: Medical

## 2017-11-19 VITALS — BP 113/73 | HR 70 | Temp 98.2°F | Resp 16 | Ht 64.0 in | Wt 126.2 lb

## 2017-11-19 DIAGNOSIS — H9312 Tinnitus, left ear: Secondary | ICD-10-CM | POA: Diagnosis not present

## 2017-11-19 DIAGNOSIS — H6982 Other specified disorders of Eustachian tube, left ear: Secondary | ICD-10-CM | POA: Diagnosis not present

## 2017-11-19 DIAGNOSIS — L603 Nail dystrophy: Secondary | ICD-10-CM

## 2017-11-19 MED ORDER — FLUTICASONE PROPIONATE 50 MCG/ACT NA SUSP: 2.0000 | Freq: Every day | NASAL | 1 refills | Status: AC

## 2017-11-19 MED FILL — FLUTICASONE PROP 50 MCG SPR: 50 | 30 days supply | Qty: 16 | Fill #0

## 2017-11-19 NOTE — Progress Notes (Signed)
Subjective:    Patient ID: Cynthia Weaver, female    DOB: 01-Dec-1985, 32 y.o.   MRN: [id number]  HPI  Pt in for first time.  Pt works at [employer], just started working out 2 weeks. Husband in school, 2 children. Pt does drink alcohol 3 days a week. 3 beers a night or half bottle of wine(describes on weekend). Nonsmoker.   Pt has some slight discolored appearance to 4th and 5th toes for year and half. Pt has been using some medication otc from [place]. She has been using that for about 3 months. No hx of trauma to nails.  She also reports slight click sound to her left ear. This is not constant. It comes and goes. This has been going on for 2 months. No nasal congestion. No hearing changes. She feels like the clicking is getting more frequent. [place] about 20 times in one day. Then became less frequent as returned to [place].   Review of Systems  Constitutional: Negative for chills, fatigue and fever.  HENT: Negative for congestion, drooling, nosebleeds, postnasal drip, sinus pressure, sinus pain, sore throat and trouble swallowing.        Ear clicking.  Respiratory: Negative for cough, chest tightness, shortness of breath and wheezing.   Cardiovascular: Negative for chest pain and palpitations.  Gastrointestinal: Negative for abdominal pain.  Musculoskeletal: Negative for back pain and neck pain.  Skin:       See hpi regarding discolored nails and thick nails.  Neurological: Negative for dizziness, speech difficulty, weakness, numbness and headaches.  Hematological: Negative for adenopathy. Does not bruise/bleed easily.  Psychiatric/Behavioral: Negative for behavioral problems and confusion.    Past Medical History:  Diagnosis Date  . Anxiety    some occasional but states normal variant.  . Chicken pox   . SVD (spontaneous vaginal delivery) 04/12/2015  . UTI (lower urinary tract infection)      Social History   Socioeconomic History   . Marital status: Married    Spouse name: Not on file  . Number of children: 1  . Years of education: 39  . Highest education level: Not on file  Occupational History  . Occupation: Human resources officer  Social Needs  . Financial resource strain: Not on file  . Food insecurity:    Worry: Not on file    Inability: Not on file  . Transportation needs:    Medical: Not on file    Non-medical: Not on file  Tobacco Use  . Smoking status: Never Smoker  . Smokeless tobacco: Never Used  Substance and Sexual Activity  . Alcohol use: Yes    Alcohol/week: 0.0 standard drinks    Comment: occasional wine or beer  . Drug use: No  . Sexual activity: Yes  Lifestyle  . Physical activity:    Days per week: Not on file    Minutes per session: Not on file  . Stress: Not on file  Relationships  . Social connections:    Talks on phone: Not on file    Gets together: Not on file    Attends religious service: Not on file    Active member of club or organization: Not on file    Attends meetings of clubs or organizations: Not on file    Relationship status: Not on file  . Intimate partner violence:    Fear of current or ex partner: Not on file    Emotionally abused: Not on file  Physically abused: Not on file    Forced sexual activity: Not on file  Other Topics Concern  . Not on file  Social History Narrative   [place], [place] and raised in [place], [place].    Currently resides in a house with husband and 2 children (daughters)   Fun: Spend time with daughter, walking   Denies any religious beliefs effecting healthcare.    Works for [employer], Primary school teacher          Past Surgical History:  Procedure Laterality Date  . WISDOM TOOTH EXTRACTION  2006    Family History  Problem Relation Age of Onset  . Healthy Mother   . Healthy Father   . Breast cancer Maternal Aunt   . Prostate cancer Maternal Uncle   . Clotting disorder Maternal Grandmother     Allergies  Allergen Reactions  .  Penicillins Hives    Has patient had a PCN reaction causing immediate rash, facial/tongue/throat swelling, SOB or lightheadedness with hypotension: Yes Has patient had a PCN reaction causing severe rash involving mucus membranes or skin necrosis: Yes Has patient had a PCN reaction that required hospitalization Yes Has patient had a PCN reaction occurring within the last 10 years: Yes If all of the above answers are "NO", then may proceed with Cephalosporin use.     No current outpatient medications on file prior to visit.   No current facility-administered medications on file prior to visit.     BP 113/73   Pulse 70   Temp 98.2 F (36.8 C) (Oral)   Resp 16   Ht 5\' 4"  (1.626 m)   Wt 126 lb 3.2 oz (57.2 kg)   SpO2 100%   BMI 21.66 kg/m       Objective:   Physical Exam  General  Mental Status - Alert. General Appearance - Well groomed. Not in acute distress.  Skin Rashes- No Rashes.  HEENT Head- Normal. Ear Auditory Canal - Left- Normal. Right - Normal.Tympanic Membrane- Left- Normal. Right- Normal. Eye Sclera/Conjunctiva- Left- Normal. Right- Normal. Nose & Sinuses Nasal Mucosa- Left-  Boggy and Congested. Right-  Boggy and  Congested.Bilateral no  maxillary and  No frontal sinus pressure. Mouth & Throat Lips: Upper Lip- Normal: no dryness, cracking, pallor, cyanosis, or vesicular eruption. Lower Lip-Normal: no dryness, cracking, pallor, cyanosis or vesicular eruption. Buccal Mucosa- Bilateral- No Aphthous ulcers. Oropharynx- No Discharge or Erythema. Tonsils: Characteristics- Bilateral- No Erythema or Congestion. Size/Enlargement- Bilateral- No enlargement. Discharge- bilateral-None.  Neck Neck- Supple. No Masses.   Chest and Lung Exam Auscultation: Breath Sounds:-Clear even and unlabored.  Cardiovascular Auscultation:Rythm- Regular, rate and rhythm. Murmurs & Other Heart Sounds:Ausculatation of the heart reveal- No Murmurs.  Lymphatic Head & Neck General  Head & Neck Lymphatics: Bilateral: Description- No Localized lymphadenopathy.  Derm- on both feet mild thick, yellowish disfigured nails.      Assessment & Plan:  Good to meet you today.  For your history of intermittent clicking of the year with association and change of altitude, I will go ahead and treat you with a Flonase nasal spray in order to see if eustachian tube dysfunction playing a role in the clicking.  Let us know approximately 10 to 14 days if this is helping.  For nail dystrophy/thickening with discoloration, I did go ahead and place fungal culture and seeing if lab can also do stain study.  We will let you know that result when is it is in.  Then decide on treatment versus referral  to dermatologist.  Recommend flu vaccine in October.  If you could call your gynecologist to see when your last Tdap was.  Follow-up date to be determined depending on response to Flonase and fungal studies.  Mackie Pai, PA-C

## 2017-11-19 NOTE — Patient Instructions (Signed)
Good to meet you today.  For your history of intermittent clicking of the year with association and change of altitude, I will go ahead and treat you with a Flonase nasal spray in order to see if eustachian tube dysfunction playing a role in the clicking.  Let us know approximately 10 to 14 days if this is helping.  For nail dystrophy/thickening with discoloration, I did go ahead and place fungal culture and seeing if lab can also do stain study.  We will let you know that result when is it is in.  Then decide on treatment versus referral to dermatologist.  Recommend flu vaccine in October.  If you could call your gynecologist to see when your last Tdap was.  Follow-up date to be determined depending on response to Flonase and fungal studies.

## 2017-12-04 LAB — CULT, FUNGUS, SKIN,HAIR,NAIL W/KOH

## 2017-12-07 ENCOUNTER — Telehealth: Payer: Self-pay

## 2017-12-07 NOTE — Telephone Encounter (Signed)
So would you explain to patient that the receiving lab lost her nail samples.  If you could give her options on next steps as I stated. Again apologize but do explain it was not our lab site fault but the receiving lab that lost her nail sample.

## 2017-12-07 NOTE — Telephone Encounter (Signed)
Fungal test of nail clippings cancelled per Mackie Pai. Routed to [place] in lab to investigate.

## 2017-12-07 NOTE — Telephone Encounter (Signed)
I think this was routed to the wrong person.   Should be Raynelle Dick, a very similar name to mine!

## 2017-12-07 NOTE — Telephone Encounter (Signed)
I spoke with Kevia at [facility] and the note/email documented in their system states the specimen was lost after it was received in the lab. We do have documentation here showing that we did send the specimen in.

## 2017-12-07 NOTE — Telephone Encounter (Signed)
-----   Message from Mackie Pai, PA-C sent at 12/04/2017  5:23 PM EDT ----- For some reason the fungal test I order was cancelled. Will you ask lab why that happened? Pt will need nail clips again to give sample. This in inconvenient for her. If she could bring in nail clippings in zip lock bag. Then we can transfer to sterile container and run test again. Find out what happened and apologize. Or I could clip them in the office to make sure get adequate large clipping. But she does not need official appointment since she should not be charged. Need to minimize inconvenience for patient. Or I could refer direct to podiatrist at this point. Apolgoize again please.

## 2017-12-08 NOTE — Telephone Encounter (Signed)
Author phoned pt. to notify, asking pt. to come in to have Edward clip and place in sterile container. Pt. Stated she will be out of town for the remainder of the week but will return and call Monday so she can stop in. Author stated she would not be charged for a visit, and it was OK just to drop in for edward to clip and for lab to process. Pt. Understanding; Percell Miller made aware.

## 2017-12-09 ENCOUNTER — Other Ambulatory Visit: Payer: BLUE CROSS/BLUE SHIELD

## 2017-12-09 DIAGNOSIS — L603 Nail dystrophy: Secondary | ICD-10-CM

## 2018-01-08 LAB — CULT, FUNGUS, SKIN,HAIR,NAIL W/KOH
MICRO NUMBER: 91183745
SMEAR: NONE SEEN
SPECIMEN QUALITY:: ADEQUATE

## 2018-04-22 IMAGING — DX DG CHEST 2V
2 series · 2 of 2 positions shown · non-contrast
Comparison: None in PACs

CLINICAL DATA: Six weeks of shortness of breath, nonsmoker.

EXAM:
CHEST  2 VIEW

[chest pa]
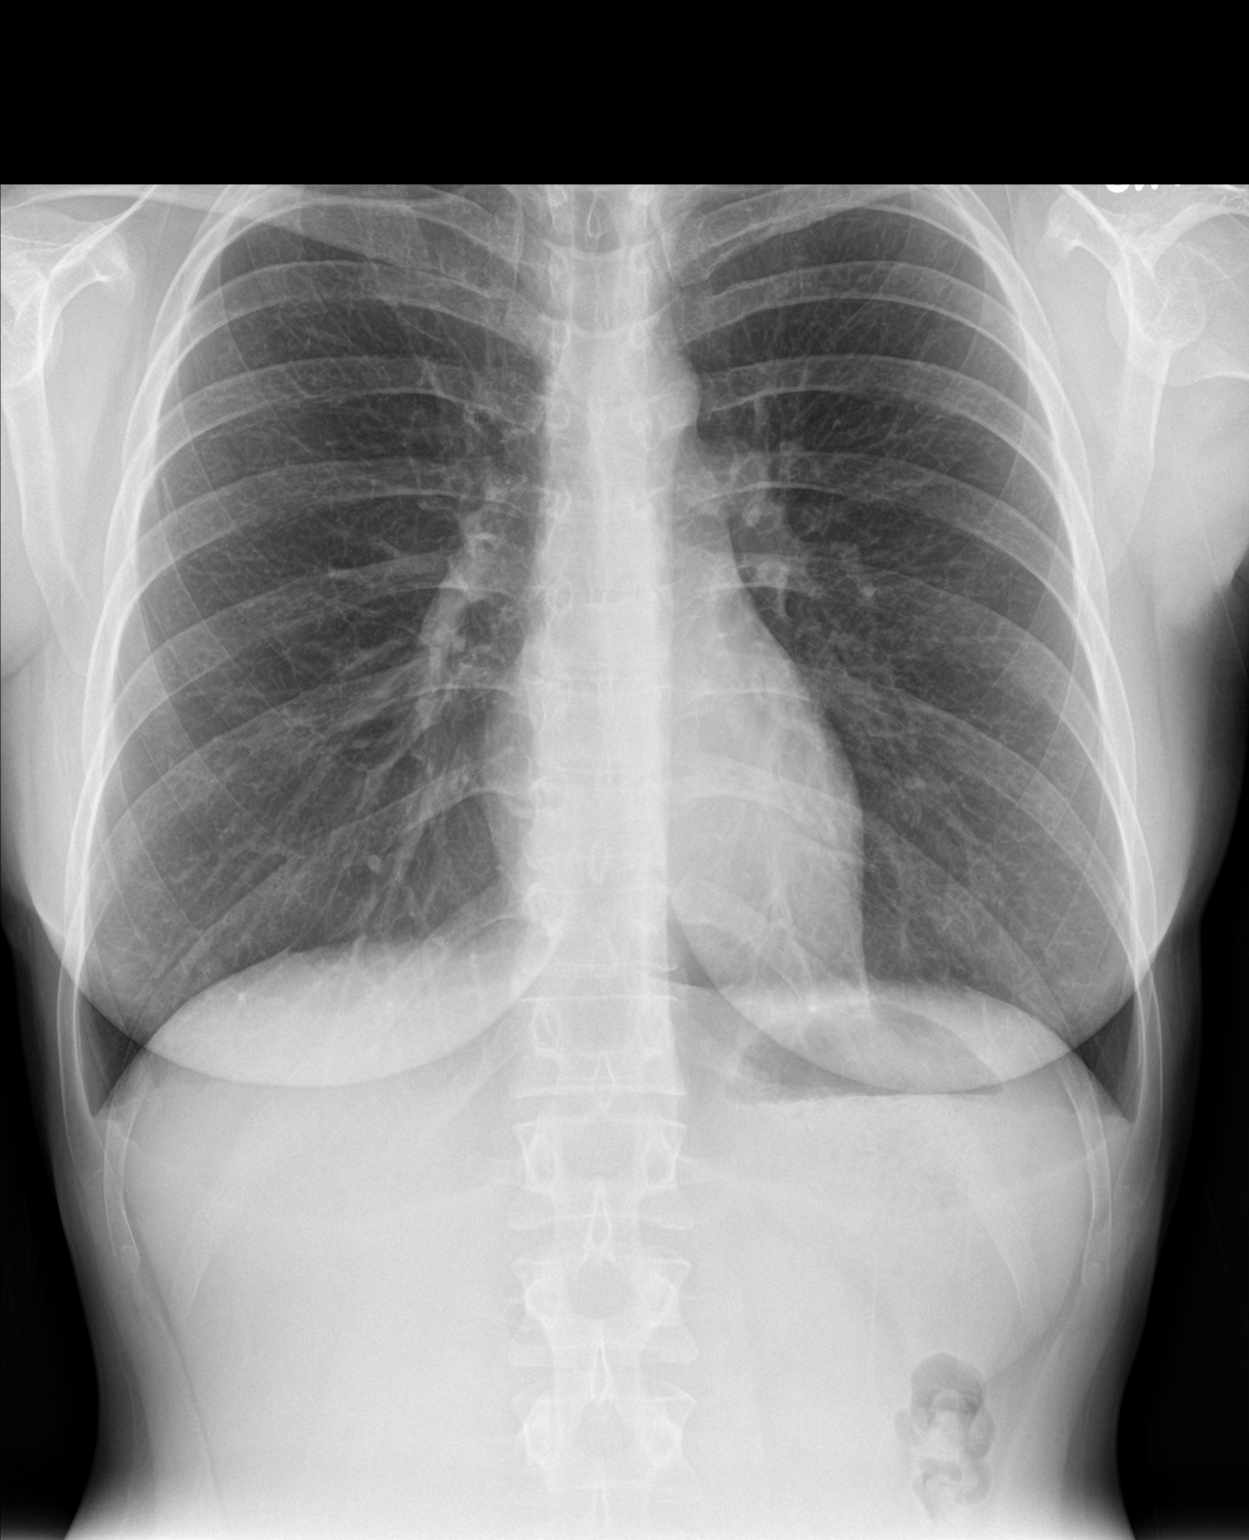

[chest lat]
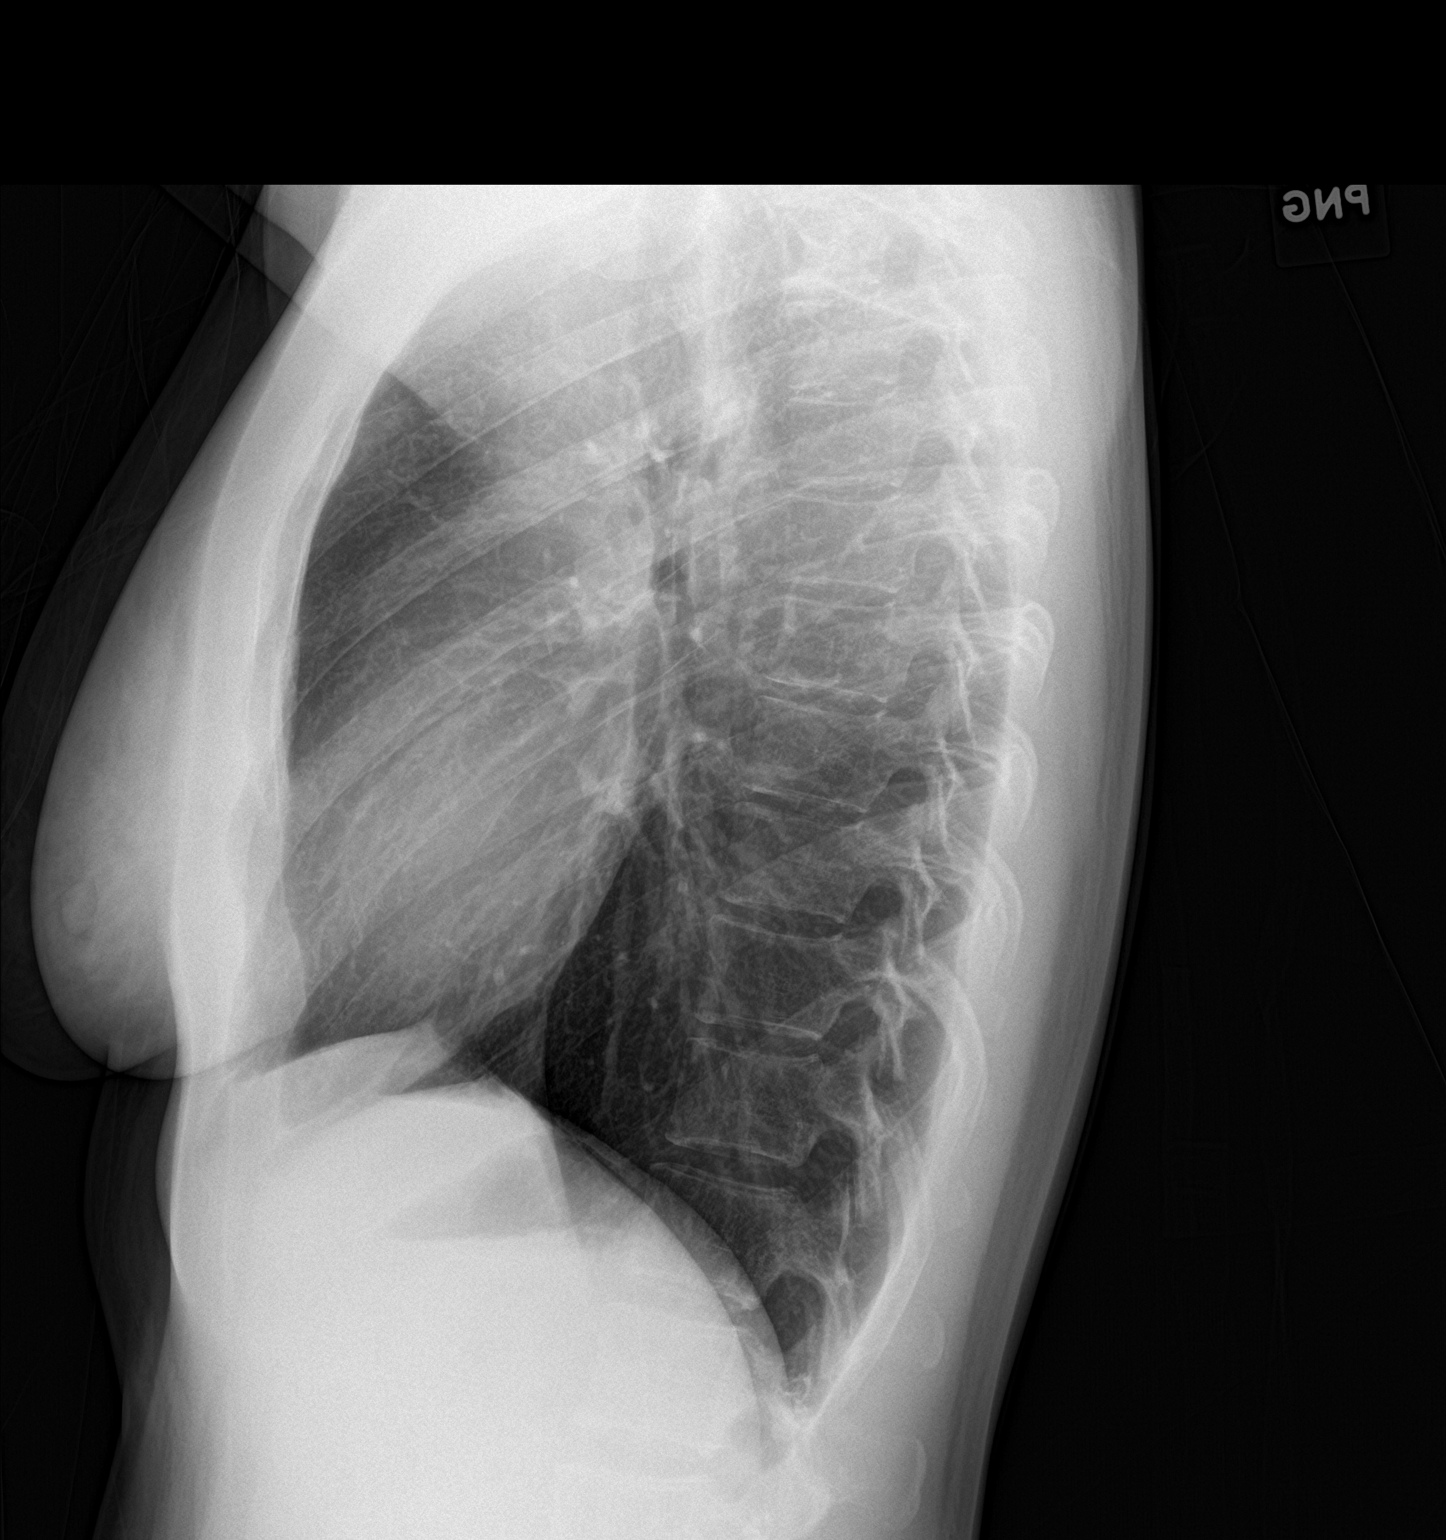

[2 of 2 positions shown; findings below may reference images not displayed]

FINDINGS: The lungs are well-expanded and clear. The heart and pulmonary
vascularity are normal. The mediastinum is normal in width. There is
no pleural effusion. The trachea is midline. The bony thorax is
unremarkable.
IMPRESSION: There is no active cardiopulmonary disease.

## 2018-05-26 DIAGNOSIS — Z30431 Encounter for routine checking of intrauterine contraceptive device: Secondary | ICD-10-CM | POA: Diagnosis not present

## 2018-05-26 DIAGNOSIS — R102 Pelvic and perineal pain: Secondary | ICD-10-CM | POA: Diagnosis not present

## 2018-10-04 DIAGNOSIS — Z130 Encounter for screening for diseases of the blood and blood-forming organs and certain disorders involving the immune mechanism: Secondary | ICD-10-CM | POA: Diagnosis not present

## 2018-10-04 DIAGNOSIS — Z6821 Body mass index (BMI) 21.0-21.9, adult: Secondary | ICD-10-CM | POA: Diagnosis not present

## 2018-10-04 DIAGNOSIS — Z01419 Encounter for gynecological examination (general) (routine) without abnormal findings: Secondary | ICD-10-CM | POA: Diagnosis not present

## 2018-10-04 DIAGNOSIS — Z1389 Encounter for screening for other disorder: Secondary | ICD-10-CM | POA: Diagnosis not present

## 2018-10-04 DIAGNOSIS — Z124 Encounter for screening for malignant neoplasm of cervix: Secondary | ICD-10-CM | POA: Diagnosis not present

## 2018-12-08 ENCOUNTER — Telehealth: Payer: Self-pay | Admitting: Medical

## 2018-12-08 NOTE — Telephone Encounter (Signed)
Appt scheduled

## 2018-12-08 NOTE — Telephone Encounter (Signed)
Please advise 

## 2018-12-08 NOTE — Telephone Encounter (Signed)
Okay to schedule visit w/ Dr. Lorelei Pont.

## 2018-12-08 NOTE — Telephone Encounter (Signed)
No problem. Thanks for update.

## 2018-12-08 NOTE — Progress Notes (Signed)
[facility] at [facility] [address], [place], [place] [address] [phone]  Date:  12/09/2018   Name:  Cynthia Weaver   DOB:  [birth year]   MRN:  [id number]  PCP:  Mackie Pai, PA-C    Chief Complaint: No chief complaint on file.   History of Present Illness:  Cynthia Weaver is a 33 y.o. very pleasant female patient who presents with the following:  Visit today with this generally healthy young woman, to discuss anxiety and stress I have not seen her previously, she is a new patient to our practice She did see Percell Miller recently for a sick visit Patient location is home, provider is at office.  Patient identity confirmed with 2 factors, she gives consent for virtual visit today  She notes that she is under a lot of stress  There are a lot of changes in her life recently Her MIL died, her husband lost his job at the start of covid- he did find a new job and is working remotely They were supposed to move for her husband's job but then the job fell though.  However they had already sold their home in [place]! They are currently in [place] but will move back to [place] soon they hope.   They have 64 and 32 yo girls  The oldest started K this year- she has been able to get back to in person learning this week finally   She works for [employer]-currently working remotely  She is trying to exercise, think positively, tried OTC st John's wort.  This helps some, but she is still struggling She notes that mental illness, depression runs in her family Her husband notes that she is not quite the same as she was- she seems unhappy, irritable She did a mental health survey online and thinks that she is more anxious than depressed   She has had these sx for about 6 months, got worse since they moved to [place] in August.  They do have some friends there but it is an [place], they prefer to be in  [place] Sleep: poor quality, gotten worse since move, she will toss and turn.  33 yo also not sleeping well.   Appetite: no change No SI or HI Substance:she is drinking a bit more alcohol since the pandemic started.  She is trying to cut back   Her sister is treated with zoloft and does well with this -[name] would be willing to try this medication as well  She is generally in good health No meds currently Not currently pregnant- she has an IUD     Patient Active Problem List   Diagnosis Date Noted  . Skin lesion 11/21/2015  . Numerous moles 11/21/2015  . Active labor at term 04/12/2015  . SVD (spontaneous vaginal delivery) 04/12/2015  . Allergic rhinitis 04/24/2014    Past Medical History:  Diagnosis Date  . Anxiety    some occasional but states normal variant.  . Chicken pox   . SVD (spontaneous vaginal delivery) 04/12/2015  . UTI (lower urinary tract infection)     Past Surgical History:  Procedure Laterality Date  . WISDOM TOOTH EXTRACTION  2006    Social History   Tobacco Use  . Smoking status: Never Smoker  . Smokeless tobacco: Never Used  Substance Use Topics  . Alcohol use: Yes    Alcohol/week: 0.0 standard drinks  Comment: occasional wine or beer  . Drug use: No    Family History  Problem Relation Age of Onset  . Healthy Mother   . Healthy Father   . Breast cancer Maternal Aunt   . Prostate cancer Maternal Uncle   . Clotting disorder Maternal Grandmother     Allergies  Allergen Reactions  . Penicillins Hives    Has patient had a PCN reaction causing immediate rash, facial/tongue/throat swelling, SOB or lightheadedness with hypotension: Yes Has patient had a PCN reaction causing severe rash involving mucus membranes or skin necrosis: Yes Has patient had a PCN reaction that required hospitalization Yes Has patient had a PCN reaction occurring within the last 10 years: Yes If all of the above answers are "NO", then may proceed with  Cephalosporin use.     Medication list has been reviewed and updated.  Current Outpatient Medications on File Prior to Visit  Medication Sig Dispense Refill  . fluticasone (FLONASE) 50 MCG/ACT nasal spray Place 2 sprays into both nostrils daily. 16 g 1   No current facility-administered medications on file prior to visit.     Review of Systems:  As per HPI- otherwise negative. No chest pain or shortness of breath No fever chills  Physical Examination: There were no vitals filed for this visit. There were no vitals filed for this visit. There is no height or weight on file to calculate BMI. Ideal Body Weight:    Pt observed over video-she appears well, normal weight and healthy.  No cough, shortness of breath, distress is noted  Assessment and Plan: Adjustment disorder with mixed anxiety and depressed mood - Plan: sertraline (ZOLOFT) 50 MG tablet  Virtual visit today to discuss an adjustment disorder with anxiety and depression.  [name] has been through a lot of changes recently, including undesired moved to [place].  She has felt more irritable and down for the last several months.  Her sister is taking Zoloft successfully.  She denies any suicidal or homicidal ideation.  We will have her start on 50 mg daily, encouraged her to continue exercise, time outdoors, positive thinking.  Encouraged her to limit alcohol to 1 drink per day while using this medication.  Asked her to update me via MyChart in a few weeks, sooner if not doing well  Signed Lamar Blinks, MD

## 2018-12-08 NOTE — Telephone Encounter (Signed)
Pt called in to schedule an apt for mental health. Pt says that she love Percell Miller but says that her sister sees Dr. Lorelei Pont for the same concern and she has been very helpful. Pt would like to know if visit could be with Dr. Lorelei Pont instead. She says that because she is a female she would feel more comfortable?  Please advise/assist - [phone]

## 2018-12-09 ENCOUNTER — Ambulatory Visit (INDEPENDENT_AMBULATORY_CARE_PROVIDER_SITE_OTHER): Payer: BC Managed Care – PPO | Admitting: Family Medicine

## 2018-12-09 ENCOUNTER — Other Ambulatory Visit: Payer: Self-pay

## 2018-12-09 ENCOUNTER — Encounter: Payer: Self-pay | Admitting: Family Medicine

## 2018-12-09 DIAGNOSIS — F4323 Adjustment disorder with mixed anxiety and depressed mood: Secondary | ICD-10-CM

## 2018-12-09 MED ORDER — SERTRALINE HCL 50 MG PO TABS: 50.0000 mg | ORAL_TABLET | Freq: Every day | ORAL | 6 refills | Status: DC

## 2019-01-17 ENCOUNTER — Encounter: Payer: Self-pay | Admitting: Family Medicine

## 2019-01-17 ENCOUNTER — Ambulatory Visit (INDEPENDENT_AMBULATORY_CARE_PROVIDER_SITE_OTHER): Payer: BC Managed Care – PPO | Admitting: Psychology

## 2019-01-17 DIAGNOSIS — F4323 Adjustment disorder with mixed anxiety and depressed mood: Secondary | ICD-10-CM | POA: Diagnosis not present

## 2019-01-26 ENCOUNTER — Ambulatory Visit (INDEPENDENT_AMBULATORY_CARE_PROVIDER_SITE_OTHER): Payer: BC Managed Care – PPO | Admitting: Psychology

## 2019-01-26 DIAGNOSIS — F4323 Adjustment disorder with mixed anxiety and depressed mood: Secondary | ICD-10-CM

## 2019-02-09 ENCOUNTER — Ambulatory Visit (INDEPENDENT_AMBULATORY_CARE_PROVIDER_SITE_OTHER): Payer: BC Managed Care – PPO | Admitting: Psychology

## 2019-02-09 DIAGNOSIS — F4323 Adjustment disorder with mixed anxiety and depressed mood: Secondary | ICD-10-CM | POA: Diagnosis not present

## 2019-02-23 ENCOUNTER — Ambulatory Visit (INDEPENDENT_AMBULATORY_CARE_PROVIDER_SITE_OTHER): Payer: BC Managed Care – PPO | Admitting: Psychology

## 2019-02-23 DIAGNOSIS — F4323 Adjustment disorder with mixed anxiety and depressed mood: Secondary | ICD-10-CM | POA: Diagnosis not present

## 2019-04-06 ENCOUNTER — Encounter: Payer: Self-pay | Admitting: Medical

## 2019-04-06 ENCOUNTER — Telehealth: Payer: Self-pay | Admitting: Medical

## 2019-04-06 NOTE — Telephone Encounter (Signed)
Opened to attempt rx med but can't load pharmacy in [place].

## 2019-04-12 ENCOUNTER — Telehealth: Payer: Self-pay

## 2019-04-12 NOTE — Telephone Encounter (Signed)
Patient called in to see if Dr. Harvie Heck can send in a prescription for (Mupirocin Cream) to her Pharmacy. Patient called the pharmacy and they do not have the prescription yet.  Per the patient Dr. Harvie Heck was suppose to send the prescription over on Thursday of last week which was 04/06/2019 Please follow up with the patient at [phone]   Thanks

## 2019-04-13 ENCOUNTER — Telehealth: Payer: Self-pay | Admitting: Medical

## 2019-04-13 ENCOUNTER — Telehealth: Payer: Self-pay

## 2019-04-13 MED ORDER — MUPIROCIN 2 % EX OINT: TOPICAL_OINTMENT | CUTANEOUS | 1 refills | Status: AC

## 2019-04-13 NOTE — Telephone Encounter (Signed)
Rx mupirocin sent to pt pharmacy.

## 2019-04-13 NOTE — Telephone Encounter (Signed)
Patient called in today this makes two days in a row that the patients pharmacy  Does not have her prescription yet.   The patient is in a need of a prescription for  (MUPIROCIN Cream)  Dr. Harvie Heck was suppose to send the prescription to the   [facility] at  [place] [phone]  Phone: [phone]   Can someone please give the patient a call on today by 5pm EST at [phone] as soon as possible.   Thanks,

## 2019-04-13 NOTE — Telephone Encounter (Signed)
Rx sent to pharmacy. In [place].

## 2019-04-13 NOTE — Telephone Encounter (Signed)
I am in [place] at the moment and you can send the Rx to [address] [place] [address]   This is pharmacy that pt wants me to send rx to.

## 2019-04-13 NOTE — Telephone Encounter (Signed)
I will send her mupirocin if the pharmacy is loaded. If you call load that [place].

## 2019-04-13 NOTE — Telephone Encounter (Signed)
Patient notified rx was sent in. 

## 2019-04-13 NOTE — Telephone Encounter (Signed)
The [facility] was already in chart.  Cynthia Weaver can you send please? Or let me know know the sig and ill send.

## 2019-04-13 NOTE — Telephone Encounter (Signed)
Patient notified that rx was sent in  

## 2019-07-05 ENCOUNTER — Other Ambulatory Visit: Payer: Self-pay

## 2019-07-05 DIAGNOSIS — F4323 Adjustment disorder with mixed anxiety and depressed mood: Secondary | ICD-10-CM

## 2019-07-05 MED ORDER — SERTRALINE HCL 50 MG PO TABS: 50.0000 mg | ORAL_TABLET | Freq: Every day | ORAL | 6 refills | Status: DC

## 2019-07-08 ENCOUNTER — Telehealth (INDEPENDENT_AMBULATORY_CARE_PROVIDER_SITE_OTHER): Payer: BC Managed Care – PPO | Admitting: Family Medicine

## 2019-07-08 ENCOUNTER — Encounter: Payer: Self-pay | Admitting: Family Medicine

## 2019-07-08 ENCOUNTER — Other Ambulatory Visit: Payer: Self-pay

## 2019-07-08 VITALS — Temp 98.0°F

## 2019-07-08 DIAGNOSIS — R319 Hematuria, unspecified: Secondary | ICD-10-CM

## 2019-07-08 DIAGNOSIS — R109 Unspecified abdominal pain: Secondary | ICD-10-CM

## 2019-07-08 MED ORDER — SULFAMETHOXAZOLE-TRIMETHOPRIM 800-160 MG PO TABS: 1.0000 | ORAL_TABLET | Freq: Two times a day (BID) | ORAL | 0 refills | Status: DC

## 2019-07-08 NOTE — Progress Notes (Signed)
Chief Complaint  Patient presents with  . Back Pain    Did At Home UTI test/was positive  . Hematuria  . Fever    low grade Sunday and Monday  . Generalized Body Aches    Covid test was negative///Monday    [name] is a 34 y.o. female here for possible UTI. Due to COVID-19 pandemic, we are interacting via web portal for an electronic face-to-face visit. I verified patient's ID using 2 identifiers. Patient agreed to proceed with visit via this method. Patient is at home, I am at office. Patient and I are present for visit.   Duration: 5 days. Symptoms: Hematuria, flank pain on left and low grade fevers Denies: urinary frequency, urinary hesitancy, urinary retention, nausea, vomiting and dysuria, vaginal discharge Hx of recurrent UTI? No  Past Medical History:  Diagnosis Date  . Anxiety    some occasional but states normal variant.  . Chicken pox   . SVD (spontaneous vaginal delivery) 04/12/2015  . UTI (lower urinary tract infection)      Temp 98 F (36.7 C) (Oral)  No conversational dyspnea Age appropriate judgment and insight Nml affect and mood  Hematuria, unspecified type - Plan: sulfamethoxazole-trimethoprim (BACTRIM DS) 800-160 MG tablet  Flank pain - Plan: sulfamethoxazole-trimethoprim (BACTRIM DS) 800-160 MG tablet  7 d Bactrim given flank pain and hx of pyelo.  Stay hydrated. Seek immediate care if pt starts to develop fevers, new/worsening symptoms, uncontrollable N/V. F/u prn. The patient voiced understanding and agreement to the plan.  [place], DO 07/08/19 10:59 AM

## 2019-07-11 ENCOUNTER — Other Ambulatory Visit: Payer: Self-pay | Admitting: Family Medicine

## 2019-07-11 MED ORDER — CIPROFLOXACIN HCL 500 MG PO TABS: 500.0000 mg | ORAL_TABLET | Freq: Two times a day (BID) | ORAL | 0 refills | Status: AC

## 2019-07-19 ENCOUNTER — Ambulatory Visit (HOSPITAL_BASED_OUTPATIENT_CLINIC_OR_DEPARTMENT_OTHER)
Admission: RE | Admit: 2019-07-19 | Discharge: 2019-07-19 | Disposition: A | Discharge status: Home / Self Care | Payer: BC Managed Care – PPO | Source: Ambulatory Visit | Attending: Medical | Admitting: Medical

## 2019-07-19 ENCOUNTER — Ambulatory Visit: Payer: BC Managed Care – PPO | Admitting: Medical

## 2019-07-19 ENCOUNTER — Other Ambulatory Visit: Payer: Self-pay

## 2019-07-19 VITALS — BP 134/88 | HR 72 | Temp 97.0°F | Resp 18 | Ht 64.0 in | Wt 114.0 lb

## 2019-07-19 DIAGNOSIS — R82998 Other abnormal findings in urine: Secondary | ICD-10-CM

## 2019-07-19 DIAGNOSIS — R319 Hematuria, unspecified: Secondary | ICD-10-CM | POA: Insufficient documentation

## 2019-07-19 DIAGNOSIS — R109 Unspecified abdominal pain: Secondary | ICD-10-CM

## 2019-07-19 LAB — CBC WITH DIFFERENTIAL/PLATELET
Basophils Absolute: 0 10*3/uL (ref 0.0–0.1)
Basophils Relative: 0.5 % (ref 0.0–3.0)
Eosinophils Absolute: 0.1 10*3/uL (ref 0.0–0.7)
Eosinophils Relative: 0.9 % (ref 0.0–5.0)
HCT: 39 % (ref 36.0–46.0)
Hemoglobin: 13.5 g/dL (ref 12.0–15.0)
Lymphocytes Relative: 26.1 % (ref 12.0–46.0)
Lymphs Abs: 2.1 10*3/uL (ref 0.7–4.0)
MCHC: 34.7 g/dL (ref 30.0–36.0)
MCV: 95.2 fl (ref 78.0–100.0)
Monocytes Absolute: 0.6 10*3/uL (ref 0.1–1.0)
Monocytes Relative: 7.6 % (ref 3.0–12.0)
Neutro Abs: 5.2 10*3/uL (ref 1.4–7.7)
Neutrophils Relative %: 64.9 % (ref 43.0–77.0)
Platelets: 397 10*3/uL (ref 150.0–400.0)
RBC: 4.09 Mil/uL (ref 3.87–5.11)
RDW: 12.1 % (ref 11.5–15.5)
WBC: 7.9 10*3/uL (ref 4.0–10.5)

## 2019-07-19 LAB — COMPREHENSIVE METABOLIC PANEL
ALT: 17 U/L (ref 0–35)
AST: 19 U/L (ref 0–37)
Albumin: 4.5 g/dL (ref 3.5–5.2)
Alkaline Phosphatase: 59 U/L (ref 39–117)
BUN: 14 mg/dL (ref 6–23)
CO2: 29 mEq/L (ref 19–32)
Calcium: 9.6 mg/dL (ref 8.4–10.5)
Chloride: 101 mEq/L (ref 96–112)
Creatinine, Ser: 0.89 mg/dL (ref 0.40–1.20)
GFR: 72.68 mL/min (ref >60.00)
Glucose, Bld: 99 mg/dL (ref 70–99)
Potassium: 4.1 mEq/L (ref 3.5–5.1)
Sodium: 135 mEq/L (ref 135–145)
Total Bilirubin: 0.7 mg/dL (ref 0.2–1.2)
Total Protein: 7.2 g/dL (ref 6.0–8.3)

## 2019-07-19 LAB — POC URINALSYSI DIPSTICK (AUTOMATED)
Bilirubin, UA: NEGATIVE
Blood, UA: NEGATIVE
Glucose, UA: NEGATIVE
Ketones, UA: NEGATIVE
Nitrite, UA: NEGATIVE
Protein, UA: NEGATIVE
Spec Grav, UA: 1.015 (ref 1.010–1.025)
Urobilinogen, UA: 0.2 E.U./dL
pH, UA: 7.5 (ref 5.0–8.0)

## 2019-07-19 MED ORDER — NITROFURANTOIN MONOHYD MACRO 100 MG PO CAPS: 100.0000 mg | ORAL_CAPSULE | Freq: Two times a day (BID) | ORAL | 0 refills | Status: AC

## 2019-07-19 MED ORDER — TRAMADOL HCL 50 MG PO TABS: 50.0000 mg | ORAL_TABLET | Freq: Four times a day (QID) | ORAL | 0 refills | Status: AC | PRN

## 2019-07-19 NOTE — Progress Notes (Addendum)
Subjective:    Patient ID: Cynthia Weaver, female    DOB: Sep 15, 1985, 34 y.o.   MRN: [id number]  HPI  Pt in for follow up for evaluation.  Pt updates me that her husband had covid and she got tested and her test came back negative on April 28th and was negative. This was second test.  Pt states over past 2 weeks she has hematuria, flank pain and low grade fevers. Pt had virtual visit and was given bactrim for 4 days then was switched to cipro for 7 days since was not having any relief. Pt had virtual visit so no urine study was done.  Pt states she stopped seeing blood in her urine around April 21st. She had hx of one kidney infection years ago in college. Back then no uti type symptom like burning.   Left flank pain has been present throughout. cipro seemed to help. Urine still looks cloudy. Today not much flank pain. Yesterday was 5/10.   Pt has moderate infection fighting cells present but no blood.   Review of Systems  Constitutional: Negative for chills, fatigue and fever.  Respiratory: Negative for cough, chest tightness, shortness of breath and wheezing.   Cardiovascular: Negative for chest pain and palpitations.  Gastrointestinal:       Flank pain  Skin: Negative for rash.  Neurological: Negative for dizziness, tremors, seizures and headaches.  Hematological: Negative for adenopathy. Does not bruise/bleed easily.  Psychiatric/Behavioral: Negative for behavioral problems, confusion and sleep disturbance. The patient is not nervous/anxious.     Past Medical History:  Diagnosis Date  . Anxiety    some occasional but states normal variant.  . Chicken pox   . SVD (spontaneous vaginal delivery) 04/12/2015  . UTI (lower urinary tract infection)      Social History   Socioeconomic History  . Marital status: Married    Spouse name: Not on file  . Number of children: 1  . Years of education: 5  . Highest education level: Not on file  Occupational History  .  Occupation: Human resources officer  Tobacco Use  . Smoking status: Never Smoker  . Smokeless tobacco: Never Used  Substance and Sexual Activity  . Alcohol use: Yes    Alcohol/week: 0.0 standard drinks    Comment: occasional wine or beer  . Drug use: No  . Sexual activity: Yes  Other Topics Concern  . Not on file  Social History Narrative   [place], [place] and raised in [place], [place].    Currently resides in a house with husband and 2 children (daughters)   Fun: Spend time with daughter, walking   Denies any religious beliefs effecting healthcare.    Works for [employer], Primary school teacher         Social Determinants of Radio broadcast assistant Strain:   . Difficulty of Paying Living Expenses:   Food Insecurity:   . Worried About Charity fundraiser in the Last Year:   . Arboriculturist in the Last Year:   Transportation Needs:   . Film/video editor (Medical):   Marland Kitchen Lack of Transportation (Non-Medical):   Physical Activity:   . Days of Exercise per Week:   . Minutes of Exercise per Session:   Stress:   . Feeling of Stress :   Social Connections:   . Frequency of Communication with Friends and Family:   . Frequency of Social Gatherings with Friends and Family:   . Attends Religious Services:   .  Active Member of Clubs or Organizations:   . Attends Archivist Meetings:   Marland Kitchen Marital Status:   Intimate Partner Violence:   . Fear of Current or Ex-Partner:   . Emotionally Abused:   Marland Kitchen Physically Abused:   . Sexually Abused:     Past Surgical History:  Procedure Laterality Date  . WISDOM TOOTH EXTRACTION  2006    Family History  Problem Relation Age of Onset  . Healthy Mother   . Healthy Father   . Breast cancer Maternal Aunt   . Prostate cancer Maternal Uncle   . Clotting disorder Maternal Grandmother     Allergies  Allergen Reactions  . Penicillins Hives    Has patient had a PCN reaction causing immediate rash, facial/tongue/throat swelling, SOB or  lightheadedness with hypotension: Yes Has patient had a PCN reaction causing severe rash involving mucus membranes or skin necrosis: Yes Has patient had a PCN reaction that required hospitalization Yes Has patient had a PCN reaction occurring within the last 10 years: Yes If all of the above answers are "NO", then may proceed with Cephalosporin use.     Current Outpatient Medications on File Prior to Visit  Medication Sig Dispense Refill  . fluticasone (FLONASE) 50 MCG/ACT nasal spray Place 2 sprays into both nostrils daily. (Patient not taking: Reported on 07/08/2019) 16 g 1  . mupirocin ointment (BACTROBAN) 2 % Apply thin film twice daily 22 g 1  . sertraline (ZOLOFT) 50 MG tablet Take 1 tablet (50 mg total) by mouth daily. 30 tablet 6   No current facility-administered medications on file prior to visit.    BP 134/88 (BP Location: Right Arm, Patient Position: Sitting, Cuff Size: Normal)   Pulse 72   Temp (!) 97 F (36.1 C)   Resp 18   Ht 5\' 4"  (1.626 m)   Wt 114 lb (51.7 kg)   SpO2 100%   BMI 19.57 kg/m       Objective:   Physical Exam   General- No acute distress. Pleasant patient. Neck- Full range of motion, no jvd Lungs- Clear, even and unlabored. Heart- regular rate and rhythm. Neurologic- CNII- XII grossly intact. Abdomen- soft, nt, nd, +bs, no rebound or guarding and no organomegaly. No flank pain presently. Back- no cva tenderness.     Assessment & Plan:   You do have recent probable urinary tract infection and I have concern for possible kidney stone since  you report some flank pain with some visible blood during course of illness.  Your urine test today showed persisting infection fighting cells.  So we will prescribe Macrobid antibiotic and send out urine culture.  Recent persisting flank pain up until yesterday causes me concern for possible kidney stone.  Though your urinalysis today did not show a blood.  I will go ahead and get CBC, CMP and 1 view  abdomen.  1 view abdomen x-ray might pick up calcium based stone.  Making tramadol available and low number if your pain were to worsen.  If studies negative but pain recurrent and severe then may need advanced to CT renal stone study.  Make sure you stay well-hydrated.  Follow-up in 7 days or as needed.   Mackie Pai, PA-C   Time spent with patient today was 30  minutes which consisted of chart review, discussing diagnosis, work up, treatment and documentation.

## 2019-07-19 NOTE — Patient Instructions (Addendum)
You do have recent probable urinary tract infection and I have concern for possible kidney stone since  you report some flank pain with some visible blood during course of illness.  Your urine test today showed persisting infection fighting cells.  So we will prescribe Macrobid antibiotic and send out urine culture.  Recent persisting flank pain up until yesterday causes me concern for possible kidney stone.  Though your urinalysis today did not show a blood.  I will go ahead and get CBC, CMP and 1 view abdomen.  1 view abdomen x-ray might pick up calcium based stone.  Making tramadol available and low number if your pain were to worsen.  If studies negative but pain recurrent and severe then may need advanced to CT renal stone study.  Make sure you stay well-hydrated.  Follow-up in 7 days or as needed.

## 2019-07-20 ENCOUNTER — Encounter: Payer: Self-pay | Admitting: Medical

## 2019-07-20 LAB — URINE CULTURE
MICRO NUMBER:: 10463782
Result:: NO GROWTH
SPECIMEN QUALITY:: ADEQUATE

## 2019-07-21 ENCOUNTER — Encounter: Payer: Self-pay | Admitting: Medical

## 2019-08-01 ENCOUNTER — Encounter: Payer: Self-pay | Admitting: Medical

## 2020-01-29 ENCOUNTER — Other Ambulatory Visit: Payer: Self-pay | Admitting: Family Medicine

## 2020-01-29 DIAGNOSIS — F4323 Adjustment disorder with mixed anxiety and depressed mood: Secondary | ICD-10-CM

## 2020-03-02 ENCOUNTER — Other Ambulatory Visit: Payer: Self-pay | Admitting: Medical

## 2020-03-02 DIAGNOSIS — F4323 Adjustment disorder with mixed anxiety and depressed mood: Secondary | ICD-10-CM

## 2020-09-03 ENCOUNTER — Other Ambulatory Visit: Payer: Self-pay | Admitting: Medical

## 2020-09-03 DIAGNOSIS — F4323 Adjustment disorder with mixed anxiety and depressed mood: Secondary | ICD-10-CM
